# Patient Record
Sex: Male | Born: 1950 | Race: White | Hispanic: No | Smoking: Current every day smoker
Health system: Southern US, Community
[De-identification: ages and names within clinical notes are randomized; demographics above are authoritative.]

## PROBLEM LIST (undated history)

## (undated) DIAGNOSIS — Z992 Dependence on renal dialysis: Secondary | ICD-10-CM

## (undated) DIAGNOSIS — G4730 Sleep apnea, unspecified: Secondary | ICD-10-CM

## (undated) DIAGNOSIS — I2510 Atherosclerotic heart disease of native coronary artery without angina pectoris: Secondary | ICD-10-CM

## (undated) DIAGNOSIS — N189 Chronic kidney disease, unspecified: Secondary | ICD-10-CM

## (undated) DIAGNOSIS — I509 Heart failure, unspecified: Secondary | ICD-10-CM

## (undated) DIAGNOSIS — I10 Essential (primary) hypertension: Secondary | ICD-10-CM

## (undated) DIAGNOSIS — D649 Anemia, unspecified: Secondary | ICD-10-CM

## (undated) HISTORY — PX: OTHER SURGICAL HISTORY: SHX169

## (undated) HISTORY — DX: Sleep apnea, unspecified: G47.30

## (undated) HISTORY — DX: Atherosclerotic heart disease of native coronary artery without angina pectoris: I25.10

## (undated) HISTORY — PX: CORONARY ANGIOPLASTY: SHX604

## (undated) HISTORY — PX: CARDIAC SURGERY: SHX584

## (undated) HISTORY — PX: APPENDECTOMY: SHX54

## (undated) HISTORY — PX: CARDIAC CATHETERIZATION: SHX172

---

## 2002-06-26 ENCOUNTER — Ambulatory Visit (HOSPITAL_COMMUNITY): Admission: RE | Admit: 2002-06-26 | Discharge: 2002-06-26 | Payer: Self-pay | Admitting: Cardiology

## 2002-06-26 ENCOUNTER — Encounter: Payer: Self-pay | Admitting: Cardiology

## 2005-10-15 DIAGNOSIS — N189 Chronic kidney disease, unspecified: Secondary | ICD-10-CM

## 2005-10-15 HISTORY — DX: Chronic kidney disease, unspecified: N18.9

## 2005-10-15 HISTORY — PX: CORONARY ARTERY BYPASS GRAFT: SHX141

## 2005-11-28 ENCOUNTER — Inpatient Hospital Stay (HOSPITAL_COMMUNITY): Admission: AD | Admit: 2005-11-28 | Discharge: 2005-12-08 | Payer: Self-pay | Admitting: Family Medicine

## 2005-12-05 ENCOUNTER — Encounter: Payer: Self-pay | Admitting: Vascular Surgery

## 2005-12-21 ENCOUNTER — Ambulatory Visit (HOSPITAL_COMMUNITY): Admission: RE | Admit: 2005-12-21 | Discharge: 2005-12-21 | Payer: Self-pay | Admitting: Vascular Surgery

## 2005-12-28 ENCOUNTER — Ambulatory Visit (HOSPITAL_COMMUNITY): Admission: RE | Admit: 2005-12-28 | Discharge: 2005-12-28 | Payer: Self-pay | Admitting: *Deleted

## 2006-02-18 ENCOUNTER — Ambulatory Visit: Payer: Self-pay | Admitting: Orthopedic Surgery

## 2006-05-08 ENCOUNTER — Inpatient Hospital Stay (HOSPITAL_COMMUNITY): Admission: RE | Admit: 2006-05-08 | Discharge: 2006-05-13 | Payer: Self-pay | Admitting: Surgery

## 2006-06-25 ENCOUNTER — Encounter: Admission: RE | Admit: 2006-06-25 | Discharge: 2006-06-25 | Payer: Self-pay | Admitting: Surgery

## 2006-09-21 ENCOUNTER — Emergency Department (HOSPITAL_COMMUNITY): Admission: EM | Admit: 2006-09-21 | Discharge: 2006-09-21 | Payer: Self-pay | Admitting: Emergency Medicine

## 2006-10-09 ENCOUNTER — Inpatient Hospital Stay (HOSPITAL_COMMUNITY): Admission: EM | Admit: 2006-10-09 | Discharge: 2006-10-14 | Payer: Self-pay | Admitting: Emergency Medicine

## 2006-10-12 ENCOUNTER — Encounter: Payer: Self-pay | Admitting: *Deleted

## 2006-11-13 ENCOUNTER — Encounter (HOSPITAL_COMMUNITY): Admission: RE | Admit: 2006-11-13 | Discharge: 2006-12-13 | Payer: Self-pay | Admitting: *Deleted

## 2006-12-16 ENCOUNTER — Encounter (HOSPITAL_COMMUNITY): Admission: RE | Admit: 2006-12-16 | Discharge: 2007-01-15 | Payer: Self-pay | Admitting: *Deleted

## 2007-01-17 ENCOUNTER — Encounter (HOSPITAL_COMMUNITY): Admission: RE | Admit: 2007-01-17 | Discharge: 2007-02-16 | Payer: Self-pay | Admitting: *Deleted

## 2007-04-21 ENCOUNTER — Encounter (HOSPITAL_COMMUNITY): Admission: RE | Admit: 2007-04-21 | Discharge: 2007-05-21 | Payer: Self-pay | Admitting: Family Medicine

## 2007-05-23 ENCOUNTER — Encounter (HOSPITAL_COMMUNITY): Admission: RE | Admit: 2007-05-23 | Discharge: 2007-06-22 | Payer: Self-pay | Admitting: Family Medicine

## 2007-12-04 DIAGNOSIS — G4730 Sleep apnea, unspecified: Secondary | ICD-10-CM

## 2007-12-04 HISTORY — DX: Sleep apnea, unspecified: G47.30

## 2008-05-12 ENCOUNTER — Ambulatory Visit: Payer: Self-pay | Admitting: Vascular Surgery

## 2008-05-17 ENCOUNTER — Inpatient Hospital Stay (HOSPITAL_COMMUNITY): Admission: RE | Admit: 2008-05-17 | Discharge: 2008-05-18 | Payer: Self-pay | Admitting: Vascular Surgery

## 2008-05-17 ENCOUNTER — Ambulatory Visit: Payer: Self-pay | Admitting: Vascular Surgery

## 2008-05-17 ENCOUNTER — Encounter: Payer: Self-pay | Admitting: Vascular Surgery

## 2008-06-02 ENCOUNTER — Ambulatory Visit: Payer: Self-pay | Admitting: Vascular Surgery

## 2008-11-17 ENCOUNTER — Ambulatory Visit: Payer: Self-pay | Admitting: Vascular Surgery

## 2009-04-15 ENCOUNTER — Encounter (INDEPENDENT_AMBULATORY_CARE_PROVIDER_SITE_OTHER): Payer: Self-pay | Admitting: *Deleted

## 2009-05-18 ENCOUNTER — Ambulatory Visit: Payer: Self-pay | Admitting: Gastroenterology

## 2009-05-18 DIAGNOSIS — K219 Gastro-esophageal reflux disease without esophagitis: Secondary | ICD-10-CM | POA: Insufficient documentation

## 2009-05-19 ENCOUNTER — Encounter: Payer: Self-pay | Admitting: Gastroenterology

## 2009-05-25 ENCOUNTER — Ambulatory Visit: Payer: Self-pay | Admitting: Gastroenterology

## 2009-05-25 ENCOUNTER — Encounter: Payer: Self-pay | Admitting: Gastroenterology

## 2009-05-25 ENCOUNTER — Ambulatory Visit (HOSPITAL_COMMUNITY): Admission: RE | Admit: 2009-05-25 | Discharge: 2009-05-25 | Payer: Self-pay | Admitting: Gastroenterology

## 2009-06-21 ENCOUNTER — Emergency Department (HOSPITAL_COMMUNITY): Admission: EM | Admit: 2009-06-21 | Discharge: 2009-06-21 | Payer: Self-pay | Admitting: Emergency Medicine

## 2009-10-04 ENCOUNTER — Emergency Department (HOSPITAL_COMMUNITY): Admission: EM | Admit: 2009-10-04 | Discharge: 2009-10-04 | Payer: Self-pay | Admitting: Emergency Medicine

## 2009-11-26 ENCOUNTER — Inpatient Hospital Stay (HOSPITAL_COMMUNITY): Admission: EM | Admit: 2009-11-26 | Discharge: 2009-11-30 | Payer: Self-pay | Admitting: Emergency Medicine

## 2009-11-29 ENCOUNTER — Encounter: Payer: Self-pay | Admitting: Gastroenterology

## 2010-01-18 DIAGNOSIS — I2510 Atherosclerotic heart disease of native coronary artery without angina pectoris: Secondary | ICD-10-CM

## 2010-01-18 HISTORY — DX: Atherosclerotic heart disease of native coronary artery without angina pectoris: I25.10

## 2010-07-10 ENCOUNTER — Encounter (INDEPENDENT_AMBULATORY_CARE_PROVIDER_SITE_OTHER): Payer: Self-pay | Admitting: *Deleted

## 2010-11-14 NOTE — Letter (Signed)
Summary: Recall Colonoscopy/Endoscopy, Change to Office Visit  [facility]  [address]   [place], [place] [address]   Phone: [phone]  Fax: [phone]      July 10, 2010   Frederick [name] [address] [place], [place]  [address] [birth year]   Dear [name],   According to our records, it is time for you to schedule a Colonoscopy/Endoscopy. However, after reviewing your medical record, we recommend an office visit in order to determine your need for a repeat procedure.  Please call [phone] at your convenience to schedule an office visit. If you have any questions or concerns, please feel free to contact our office.   Sincerely,   Ave Filter  [facility] Ph: [phone]   Fax: [phone]

## 2010-11-17 ENCOUNTER — Ambulatory Visit (HOSPITAL_COMMUNITY)
Admission: RE | Admit: 2010-11-17 | Discharge: 2010-11-17 | Disposition: A | Discharge status: Home / Self Care | Payer: Medicare Other | Source: Ambulatory Visit | Attending: Vascular Surgery | Admitting: Vascular Surgery

## 2010-11-17 DIAGNOSIS — N186 End stage renal disease: Secondary | ICD-10-CM | POA: Insufficient documentation

## 2010-11-17 DIAGNOSIS — Z538 Procedure and treatment not carried out for other reasons: Secondary | ICD-10-CM | POA: Insufficient documentation

## 2010-11-17 LAB — POCT I-STAT, CHEM 8
BUN: 45 mg/dL — ABNORMAL HIGH (ref 6–23)
Calcium, Ion: 1.31 mmol/L (ref 1.12–1.32)
Chloride: 101 mEq/L (ref 96–112)
HCT: 27 % — ABNORMAL LOW (ref 39.0–52.0)
Potassium: 3.9 mEq/L (ref 3.5–5.1)
TCO2: 30 mmol/L (ref 0–100)

## 2010-11-23 ENCOUNTER — Ambulatory Visit (INDEPENDENT_AMBULATORY_CARE_PROVIDER_SITE_OTHER): Payer: Medicare Other | Admitting: Vascular Surgery

## 2010-11-23 DIAGNOSIS — N186 End stage renal disease: Secondary | ICD-10-CM

## 2010-11-24 NOTE — Assessment & Plan Note (Signed)
OFFICE VISIT  CADEL, STAIRS DOB:  1951/08/23                                       11/23/2010 NUUVO#:[id number]  CHIEF COMPLAINT:  Poor flow left arm AV fistula.  HISTORY OF PRESENT ILLNESS:  The patient is a 60 year old male who is currently on chronic hemodialysis who was noted recently to have some diminished flow on a hemodialysis session.  The patient has had a longstanding left upper arm AV fistula.  He was recently scheduled for a fistulogram.  However, at the time of the fistulogram I discussed with the patient that I did not believe he needed a fistulogram, he just needed revision of his fistula due to longstanding chronic aneurysmal degeneration.  He presents today for further followup regarding this. He denies any symptoms of steal in his left hand.  He states that the fistula has actually been working well with better flow since last time he was seen a week ago.  REVIEW OF SYSTEMS:  He denies any shortness of breath or chest pain.  PHYSICAL EXAM:  Vital signs:  Blood pressure is 167/91 in the right arm, heart rate is 70 and regular, respirations 18.  Left upper extremity has an easily palpable thrill.  There is aneurysmal degeneration of the left upper arm AV fistula with 2 aneurysms greater than 4 cm in diameter. There is some shiny skin and thinning of the skin over these aneurysms.  I believe the best option for the patient at this point would be revision of his AV fistula with placement of a Gore-Tex interposition graft around the 2 areas of aneurysm.  I believe he is at risk of bleeding long-term and also in light of the fact that he is having flow problems he most likely has some element of stenosis at the areas of these aneurysms.  We have scheduled him for revision of the left arm AV fistula and converting to an AV graft on 12/04/2010.  We will also need to place a catheter at that time as the graft will not be stickable  for several weeks after that.  The risks, benefits, possible complications were explained to the patient today including but not limited to graft thrombosis, bleeding, infection.  He understands and agrees to proceed.    Janetta Hora. Fields, MD Electronically Signed  CEF/MEDQ  D:  11/24/2010  T:  11/24/2010  Job:  [id number]  cc:   [facility]

## 2010-12-04 ENCOUNTER — Ambulatory Visit (HOSPITAL_COMMUNITY): Payer: Medicare Other

## 2010-12-04 ENCOUNTER — Ambulatory Visit (HOSPITAL_COMMUNITY)
Admission: RE | Admit: 2010-12-04 | Discharge: 2010-12-04 | Disposition: A | Discharge status: Home / Self Care | Payer: Medicare Other | Source: Ambulatory Visit | Attending: Vascular Surgery | Admitting: Vascular Surgery

## 2010-12-04 DIAGNOSIS — Z130 Encounter for screening for diseases of the blood and blood-forming organs and certain disorders involving the immune mechanism: Secondary | ICD-10-CM | POA: Insufficient documentation

## 2010-12-04 DIAGNOSIS — T82898A Other specified complication of vascular prosthetic devices, implants and grafts, initial encounter: Secondary | ICD-10-CM | POA: Insufficient documentation

## 2010-12-04 DIAGNOSIS — N186 End stage renal disease: Secondary | ICD-10-CM

## 2010-12-04 DIAGNOSIS — I120 Hypertensive chronic kidney disease with stage 5 chronic kidney disease or end stage renal disease: Secondary | ICD-10-CM

## 2010-12-04 DIAGNOSIS — Z01810 Encounter for preprocedural cardiovascular examination: Secondary | ICD-10-CM | POA: Insufficient documentation

## 2010-12-04 DIAGNOSIS — Y832 Surgical operation with anastomosis, bypass or graft as the cause of abnormal reaction of the patient, or of later complication, without mention of misadventure at the time of the procedure: Secondary | ICD-10-CM | POA: Insufficient documentation

## 2010-12-04 LAB — POCT I-STAT 4, (NA,K, GLUC, HGB,HCT)
Glucose, Bld: 104 mg/dL — ABNORMAL HIGH (ref 70–99)
HCT: 30 % — ABNORMAL LOW (ref 39.0–52.0)
Potassium: 4.4 mEq/L (ref 3.5–5.1)

## 2010-12-06 NOTE — Op Note (Signed)
NAMEPREVIN, JIAN NO.:  [id number]  MEDICAL RECORD NO.:  [id number]           PATIENT TYPE:  O  LOCATION:  [facility]                         FACILITY:  [facility]  PHYSICIAN:  Iasiah Ozment E. Tila Millirons, MD  DATE OF BIRTH:  01/01/1951  DATE OF PROCEDURE:  12/04/2010 DATE OF DISCHARGE:  12/04/2010                              OPERATIVE REPORT   PROCEDURES: 1. Left upper arm arteriovenous graft. 2. Ultrasound of the neck. 3. Placement of right internal jugular vein Diatek catheter.  PREOPERATIVE DIAGNOSES: 1. Aneurysmal degeneration, left upper arm arteriovenous fistula. 2. End-stage renal disease.  POSTOPERATIVE DIAGNOSES: 1. Aneurysmal degeneration, left upper arm arteriovenous fistula. 2. End-stage renal disease.  ANESTHESIA:  Local with IV sedation.  ASSISTANT:  Pecola Leisure, PA-C  OPERATIVE FINDINGS: 1. A 23-cm Diatek catheter, right internal jugular vein. 2. An 8-mm interposition graft, left upper arm.OPERATIVE DETAILS:  After obtaining informed consent, the patient was taken to the operating room.  The patient was placed in the supine position on the operating table.  After adequate sedation, the patient's entire neck and chest were prepped and draped in usual sterile fashion. Local anesthesia was infiltrated over the right internal jugular vein. Ultrasound was used to identify the right internal jugular vein.  This had normal compressibility and respiratory variation.  Next, an introducer needle was used to cannulate the right internal jugular vein using ultrasound guidance.  A 0.035 J-tip guidewire was then threaded through the right internal jugular vein down into the inferior vena cava under fluoroscopic guidance.  Sequential 12, 14, 16-French dilators with peel-away sheath were then placed over the guidewire in the right atrium.  The guidewire and dilator removed and a 23-cm Diatek catheter was placed through the peel-away sheath in the right atrium.   Catheter was then tunneled subcutaneously, cut to length and the hub attached. The catheters noted to flush and draw easily.  Catheter was inspected under fluoroscopy, found its tip to be in the right atrium, no kinks throughout its course.  The catheter was sutured to skin with nylon sutures.  Neck insertion site was closed with Vicryl stitch.  Catheter was then loaded with concentrated heparin solution.  Attention was then turned to the patient's left upper extremity.  The patient had aneurysmal degeneration of the left upper arm AV fistula.  The entire left upper extremity was prepped and draped in usual sterile fashion. Next, an oblique incision was made near the antecubital crease, incision was carried down through subcutaneous tissues down to the level of preexisting AV fistula.  Dissection was carried all the way down to the level of the arterial anastomosis.  The fistula was dissected free circumferentially in this location.  This was several centimeters below the primary proximal aneurysm.  Next, a transverse incision was made in the upper arm above the area of a second degenerated aneurysm.  This was done after infiltration with local anesthesia.  Incision was carried down through subcutaneous tissues down level of fistula.  The fistula was dissected free circumferentially.  Vessel loops placed around this. Fistula diameter was fairly large proximally and distally and an 8-mm graft  was selected to replace the aneurysmal segment.  Next, a subcutaneous tunnel was created lateral to the old aneurysm, so an 8-mm PTFE graft was brought through this.  The patient was then given 5000 units of intravenous heparin.  The graft was clamped at the level of the arterial anastomosis and distally just below the proximal aneurysm.  The fistula was then divided at this level.  The aneurysmal segment was oversewn with running 5-0 Prolene suture.  The proximal segment was then sewn end-to-end to  38-mm graft using a running 6-0 Prolene suture.  Just prior to completion anastomosis, this was fore-bled, back-bled, and thoroughly flushed.  Anastomosis was secured.  Clamps released as pulsatile flow in the distal portion of the graft immediately.  Proximal anastomosis were packed with thrombin and Gelfoam.  Attention was then turned to the distal anastomosis.  The distal AV fistula was clamped with a fistula clamp.  The proximal AV fistula was then transected with scissors and oversewn just distal to the more distal aneurysm.  After hemostasis was obtained, the graft was cut to length and sewn end-to-end to the old segment of cephalic vein using a running 6-0 Prolene suture. Just prior to completion anastomosis, this was fore-bled, back-bled, and thoroughly flushed.  Anastomosis was secured.  Clamps released as palpable thrill above graft immediately.  Hemostasis was obtained with a repair stitch at the proximal and the distal anastomosis with some thrombin and Gelfoam direct pressure.  The patient was also given 50 mg of protamine.  The patient tolerated the procedure well and there were no complications.  Sponge and needle counts correct at the end of the case.  The patient was taken to recovery room in stable condition.     Janetta Hora. Haydin Dunn, MD     CEF/MEDQ  D:  12/04/2010  T:  12/05/2010  Job:  [id number]  Electronically Signed by Fabienne Bruns MD on 12/06/2010 03:09:34 PM

## 2010-12-12 ENCOUNTER — Other Ambulatory Visit (HOSPITAL_COMMUNITY): Payer: Medicare Other

## 2010-12-13 ENCOUNTER — Other Ambulatory Visit: Payer: Self-pay | Admitting: Nephrology

## 2010-12-13 ENCOUNTER — Encounter (HOSPITAL_COMMUNITY): Payer: Medicare Other | Attending: Oncology

## 2010-12-13 DIAGNOSIS — N186 End stage renal disease: Secondary | ICD-10-CM | POA: Insufficient documentation

## 2010-12-13 DIAGNOSIS — D631 Anemia in chronic kidney disease: Secondary | ICD-10-CM | POA: Insufficient documentation

## 2010-12-13 DIAGNOSIS — D649 Anemia, unspecified: Secondary | ICD-10-CM

## 2010-12-13 DIAGNOSIS — Z992 Dependence on renal dialysis: Secondary | ICD-10-CM | POA: Insufficient documentation

## 2010-12-13 DIAGNOSIS — I120 Hypertensive chronic kidney disease with stage 5 chronic kidney disease or end stage renal disease: Secondary | ICD-10-CM | POA: Insufficient documentation

## 2010-12-13 DIAGNOSIS — N039 Chronic nephritic syndrome with unspecified morphologic changes: Secondary | ICD-10-CM | POA: Insufficient documentation

## 2010-12-13 LAB — ABO/RH: ABO/RH(D): A POS

## 2010-12-15 ENCOUNTER — Encounter (HOSPITAL_COMMUNITY): Payer: Medicare Other

## 2010-12-15 ENCOUNTER — Encounter (HOSPITAL_COMMUNITY): Payer: Medicare Other | Attending: Oncology

## 2010-12-15 DIAGNOSIS — D631 Anemia in chronic kidney disease: Secondary | ICD-10-CM | POA: Insufficient documentation

## 2010-12-15 DIAGNOSIS — N039 Chronic nephritic syndrome with unspecified morphologic changes: Secondary | ICD-10-CM | POA: Insufficient documentation

## 2010-12-15 DIAGNOSIS — N186 End stage renal disease: Secondary | ICD-10-CM | POA: Insufficient documentation

## 2010-12-15 DIAGNOSIS — Z992 Dependence on renal dialysis: Secondary | ICD-10-CM | POA: Insufficient documentation

## 2010-12-16 LAB — CROSSMATCH
ABO/RH(D): A POS
Antibody Screen: NEGATIVE
Unit division: 0
Unit division: 0

## 2011-01-03 LAB — DIFFERENTIAL
Basophils Absolute: 0 10*3/uL (ref 0.0–0.1)
Basophils Absolute: 0 10*3/uL (ref 0.0–0.1)
Basophils Absolute: 0 10*3/uL (ref 0.0–0.1)
Basophils Relative: 0 % (ref 0–1)
Basophils Relative: 0 % (ref 0–1)
Eosinophils Absolute: 0 10*3/uL (ref 0.0–0.7)
Eosinophils Absolute: 0 10*3/uL (ref 0.0–0.7)
Eosinophils Absolute: 0 10*3/uL (ref 0.0–0.7)
Eosinophils Relative: 0 % (ref 0–5)
Lymphocytes Relative: 10 % — ABNORMAL LOW (ref 12–46)
Lymphocytes Relative: 5 % — ABNORMAL LOW (ref 12–46)
Lymphocytes Relative: 7 % — ABNORMAL LOW (ref 12–46)
Lymphs Abs: 0.5 10*3/uL — ABNORMAL LOW (ref 0.7–4.0)
Monocytes Absolute: 0.5 10*3/uL (ref 0.1–1.0)
Monocytes Absolute: 0.5 10*3/uL (ref 0.1–1.0)
Monocytes Absolute: 0.6 10*3/uL (ref 0.1–1.0)
Monocytes Relative: 10 % (ref 3–12)
Monocytes Relative: 12 % (ref 3–12)
Monocytes Relative: 5 % (ref 3–12)
Neutro Abs: 9.3 10*3/uL — ABNORMAL HIGH (ref 1.7–7.7)
Neutrophils Relative %: 77 % (ref 43–77)
Neutrophils Relative %: 86 % — ABNORMAL HIGH (ref 43–77)
Neutrophils Relative %: 88 % — ABNORMAL HIGH (ref 43–77)
Neutrophils Relative %: 90 % — ABNORMAL HIGH (ref 43–77)

## 2011-01-03 LAB — BASIC METABOLIC PANEL
BUN: 27 mg/dL — ABNORMAL HIGH (ref 6–23)
BUN: 69 mg/dL — ABNORMAL HIGH (ref 6–23)
BUN: 72 mg/dL — ABNORMAL HIGH (ref 6–23)
CO2: 22 mEq/L (ref 19–32)
CO2: 23 mEq/L (ref 19–32)
CO2: 26 mEq/L (ref 19–32)
Calcium: 8.7 mg/dL (ref 8.4–10.5)
Calcium: 8.9 mg/dL (ref 8.4–10.5)
Chloride: 100 mEq/L (ref 96–112)
Chloride: 102 mEq/L (ref 96–112)
Chloride: 99 mEq/L (ref 96–112)
Creatinine, Ser: 6.4 mg/dL — ABNORMAL HIGH (ref 0.4–1.5)
Creatinine, Ser: 8.12 mg/dL — ABNORMAL HIGH (ref 0.4–1.5)
Creatinine, Ser: 9.89 mg/dL — ABNORMAL HIGH (ref 0.4–1.5)
Creatinine, Ser: 9.97 mg/dL — ABNORMAL HIGH (ref 0.4–1.5)
GFR calc Af Amer: 7 mL/min — ABNORMAL LOW (ref >60)
GFR calc Af Amer: 8 mL/min — ABNORMAL LOW (ref >60)
GFR calc non Af Amer: 5 mL/min — ABNORMAL LOW (ref >60)
Glucose, Bld: 115 mg/dL — ABNORMAL HIGH (ref 70–99)
Glucose, Bld: 86 mg/dL (ref 70–99)
Potassium: 4.6 mEq/L (ref 3.5–5.1)
Sodium: 136 mEq/L (ref 135–145)
Sodium: 140 mEq/L (ref 135–145)

## 2011-01-03 LAB — CLOSTRIDIUM DIFFICILE EIA: C difficile Toxins A+B, EIA: NEGATIVE

## 2011-01-03 LAB — BLOOD GAS, ARTERIAL
Acid-base deficit: 3.4 mmol/L — ABNORMAL HIGH (ref 0.0–2.0)
FIO2: 100 %
TCO2: 19.6 mmol/L (ref 0–100)
pCO2 arterial: 38.1 mmHg (ref 35.0–45.0)
pCO2 arterial: 41.5 mmHg (ref 35.0–45.0)
pH, Arterial: 7.363 (ref 7.350–7.450)
pH, Arterial: 7.38 (ref 7.350–7.450)
pO2, Arterial: 52.4 mmHg — ABNORMAL LOW (ref 80.0–100.0)
pO2, Arterial: 53.3 mmHg — ABNORMAL LOW (ref 80.0–100.0)

## 2011-01-03 LAB — CK TOTAL AND CKMB (NOT AT ARMC)
CK, MB: 1.8 ng/mL (ref 0.3–4.0)
CK, MB: 14.4 ng/mL (ref 0.3–4.0)
CK, MB: 5.5 ng/mL — ABNORMAL HIGH (ref 0.3–4.0)
Relative Index: 0.9 (ref 0.0–2.5)
Total CK: 119 U/L (ref 7–232)
Total CK: 192 U/L (ref 7–232)
Total CK: 233 U/L — ABNORMAL HIGH (ref 7–232)

## 2011-01-03 LAB — HEPATIC FUNCTION PANEL
AST: 61 U/L — ABNORMAL HIGH (ref 0–37)
Albumin: 3.5 g/dL (ref 3.5–5.2)
Total Protein: 6.6 g/dL (ref 6.0–8.3)

## 2011-01-03 LAB — CBC
HCT: 35.7 % — ABNORMAL LOW (ref 39.0–52.0)
Hemoglobin: 11.7 g/dL — ABNORMAL LOW (ref 13.0–17.0)
Hemoglobin: 11.8 g/dL — ABNORMAL LOW (ref 13.0–17.0)
MCHC: 32.3 g/dL (ref 30.0–36.0)
MCHC: 32.7 g/dL (ref 30.0–36.0)
MCV: 100.8 fL — ABNORMAL HIGH (ref 78.0–100.0)
MCV: 98.8 fL (ref 78.0–100.0)
Platelets: 106 10*3/uL — ABNORMAL LOW (ref 150–400)
Platelets: 91 10*3/uL — ABNORMAL LOW (ref 150–400)
Platelets: 95 10*3/uL — ABNORMAL LOW (ref 150–400)
RBC: 3.57 MIL/uL — ABNORMAL LOW (ref 4.22–5.81)
RBC: 3.64 MIL/uL — ABNORMAL LOW (ref 4.22–5.81)
RDW: 18.7 % — ABNORMAL HIGH (ref 11.5–15.5)
WBC: 10.4 10*3/uL (ref 4.0–10.5)
WBC: 5.2 10*3/uL (ref 4.0–10.5)
WBC: 6.9 10*3/uL (ref 4.0–10.5)
WBC: 7.2 10*3/uL (ref 4.0–10.5)

## 2011-01-03 LAB — TROPONIN I
Troponin I: 0.17 ng/mL — ABNORMAL HIGH (ref 0.00–0.06)
Troponin I: 16.25 ng/mL (ref 0.00–0.06)

## 2011-01-03 LAB — GIARDIA/CRYPTOSPORIDIUM SCREEN(EIA): Giardia Screen - EIA: NEGATIVE

## 2011-01-03 LAB — FECAL LACTOFERRIN, QUANT

## 2011-01-03 LAB — MRSA PCR SCREENING: MRSA by PCR: NEGATIVE

## 2011-01-03 LAB — CULTURE, BLOOD (ROUTINE X 2): Report Status: 2172011

## 2011-01-03 LAB — BRAIN NATRIURETIC PEPTIDE: Pro B Natriuretic peptide (BNP): >3200 pg/mL — ABNORMAL HIGH (ref 0.0–100.0)

## 2011-01-03 LAB — LACTIC ACID, PLASMA: Lactic Acid, Venous: 1.6 mmol/L (ref 0.5–2.2)

## 2011-01-03 LAB — STOOL CULTURE

## 2011-01-04 ENCOUNTER — Ambulatory Visit (INDEPENDENT_AMBULATORY_CARE_PROVIDER_SITE_OTHER): Payer: Medicare Other | Admitting: Vascular Surgery

## 2011-01-04 DIAGNOSIS — N186 End stage renal disease: Secondary | ICD-10-CM

## 2011-01-05 NOTE — Assessment & Plan Note (Signed)
OFFICE VISIT  ARTUR, WINNINGHAM DOB:  11-15-1950                                       01/04/2011 [id number]#:[id number]  The patient returns for followup today after revision of his AV fistula to an AV graft for aneurysmal degeneration.  He states that they successfully cannulated his graft today.  He reports no drainage from the incision.  He has no symptoms of steal in the hand.  PHYSICAL EXAM:  Today blood pressure is 177/90 in the right arm, heart rate is 80 and regular, respirations 20, temperature 97.8.  Left upper extremity:  The incisions are well-healed over the left arm graft. There is an audible bruit within this.  He states that his catheter is working okay.  There is no evidence of infection on this today.  Overall the patient appears to be well at this point.  As soon as they are satisfied that the graft is working well they will schedule him in the near future for removal of his catheter.  He will follow up with me on an as needed basis.    Janetta Hora. Fields, MD Electronically Signed  CEF/MEDQ  D:  01/04/2011  T:  01/05/2011  Job:  [id number]  cc:   [facility]

## 2011-01-07 ENCOUNTER — Inpatient Hospital Stay (HOSPITAL_COMMUNITY)
Admission: EM | Admit: 2011-01-07 | Discharge: 2011-01-10 | DRG: 246 | Disposition: A | Discharge status: Home / Self Care | Payer: Medicare Other | Attending: Cardiovascular Disease | Admitting: Cardiovascular Disease

## 2011-01-07 ENCOUNTER — Emergency Department (HOSPITAL_COMMUNITY): Payer: Medicare Other

## 2011-01-07 ENCOUNTER — Inpatient Hospital Stay (HOSPITAL_COMMUNITY): Payer: Medicare Other

## 2011-01-07 DIAGNOSIS — I25810 Atherosclerosis of coronary artery bypass graft(s) without angina pectoris: Secondary | ICD-10-CM | POA: Diagnosis present

## 2011-01-07 DIAGNOSIS — I2582 Chronic total occlusion of coronary artery: Secondary | ICD-10-CM | POA: Diagnosis present

## 2011-01-07 DIAGNOSIS — I120 Hypertensive chronic kidney disease with stage 5 chronic kidney disease or end stage renal disease: Secondary | ICD-10-CM | POA: Diagnosis present

## 2011-01-07 DIAGNOSIS — I059 Rheumatic mitral valve disease, unspecified: Secondary | ICD-10-CM | POA: Diagnosis present

## 2011-01-07 DIAGNOSIS — Z7982 Long term (current) use of aspirin: Secondary | ICD-10-CM

## 2011-01-07 DIAGNOSIS — N186 End stage renal disease: Secondary | ICD-10-CM | POA: Diagnosis present

## 2011-01-07 DIAGNOSIS — R195 Other fecal abnormalities: Secondary | ICD-10-CM | POA: Diagnosis not present

## 2011-01-07 DIAGNOSIS — I2589 Other forms of chronic ischemic heart disease: Secondary | ICD-10-CM | POA: Diagnosis present

## 2011-01-07 DIAGNOSIS — D649 Anemia, unspecified: Secondary | ICD-10-CM | POA: Diagnosis present

## 2011-01-07 DIAGNOSIS — I2510 Atherosclerotic heart disease of native coronary artery without angina pectoris: Secondary | ICD-10-CM | POA: Diagnosis present

## 2011-01-07 DIAGNOSIS — Z7902 Long term (current) use of antithrombotics/antiplatelets: Secondary | ICD-10-CM

## 2011-01-07 DIAGNOSIS — E785 Hyperlipidemia, unspecified: Secondary | ICD-10-CM | POA: Diagnosis present

## 2011-01-07 DIAGNOSIS — Z992 Dependence on renal dialysis: Secondary | ICD-10-CM

## 2011-01-07 DIAGNOSIS — Z79899 Other long term (current) drug therapy: Secondary | ICD-10-CM

## 2011-01-07 DIAGNOSIS — I214 Non-ST elevation (NSTEMI) myocardial infarction: Principal | ICD-10-CM | POA: Diagnosis present

## 2011-01-07 DIAGNOSIS — D696 Thrombocytopenia, unspecified: Secondary | ICD-10-CM | POA: Diagnosis present

## 2011-01-07 LAB — POCT I-STAT 3, ART BLOOD GAS (G3+)
Bicarbonate: 37 mEq/L — ABNORMAL HIGH (ref 20.0–24.0)
Patient temperature: 98.7
pH, Arterial: 7.432 (ref 7.350–7.450)
pO2, Arterial: 60 mmHg — ABNORMAL LOW (ref 80.0–100.0)

## 2011-01-07 LAB — POCT I-STAT, CHEM 8
Glucose, Bld: 157 mg/dL — ABNORMAL HIGH (ref 70–99)
HCT: 40 % (ref 39.0–52.0)
Hemoglobin: 13.6 g/dL (ref 13.0–17.0)
Potassium: 4.3 mEq/L (ref 3.5–5.1)

## 2011-01-07 LAB — MRSA PCR SCREENING: MRSA by PCR: NEGATIVE

## 2011-01-07 LAB — DIFFERENTIAL
Basophils Absolute: 0.1 10*3/uL (ref 0.0–0.1)
Basophils Relative: 1 % (ref 0–1)
Eosinophils Absolute: 0.1 10*3/uL (ref 0.0–0.7)
Eosinophils Relative: 1 % (ref 0–5)
Lymphocytes Relative: 10 % — ABNORMAL LOW (ref 12–46)

## 2011-01-07 LAB — CBC
HCT: 38.9 % — ABNORMAL LOW (ref 39.0–52.0)
Hemoglobin: 10.8 g/dL — ABNORMAL LOW (ref 13.0–17.0)
MCHC: 30.4 g/dL (ref 30.0–36.0)
Platelets: 127 10*3/uL — ABNORMAL LOW (ref 150–400)
Platelets: 97 10*3/uL — ABNORMAL LOW (ref 150–400)
RBC: 3.42 MIL/uL — ABNORMAL LOW (ref 4.22–5.81)
RDW: 16.5 % — ABNORMAL HIGH (ref 11.5–15.5)
WBC: 11.1 10*3/uL — ABNORMAL HIGH (ref 4.0–10.5)

## 2011-01-07 LAB — TSH: TSH: 1.068 u[IU]/mL (ref 0.350–4.500)

## 2011-01-07 LAB — BASIC METABOLIC PANEL
BUN: 25 mg/dL — ABNORMAL HIGH (ref 6–23)
Chloride: 91 mEq/L — ABNORMAL LOW (ref 96–112)
Glucose, Bld: 160 mg/dL — ABNORMAL HIGH (ref 70–99)
Potassium: 4.1 mEq/L (ref 3.5–5.1)

## 2011-01-07 LAB — MAGNESIUM: Magnesium: 2.8 mg/dL — ABNORMAL HIGH (ref 1.5–2.5)

## 2011-01-07 LAB — POCT CARDIAC MARKERS
CKMB, poc: 3 ng/mL (ref 1.0–8.0)
Myoglobin, poc: 480 ng/mL (ref 12–200)

## 2011-01-07 LAB — TROPONIN I: Troponin I: 0.07 ng/mL — ABNORMAL HIGH (ref 0.00–0.06)

## 2011-01-07 LAB — CARDIAC PANEL(CRET KIN+CKTOT+MB+TROPI)
CK, MB: 12.8 ng/mL (ref 0.3–4.0)
Relative Index: 11.7 — ABNORMAL HIGH (ref 0.0–2.5)

## 2011-01-07 LAB — CK TOTAL AND CKMB (NOT AT ARMC): CK, MB: 2.8 ng/mL (ref 0.3–4.0)

## 2011-01-07 LAB — PHOSPHORUS: Phosphorus: 3.9 mg/dL (ref 2.3–4.6)

## 2011-01-08 ENCOUNTER — Inpatient Hospital Stay (HOSPITAL_COMMUNITY): Payer: Medicare Other

## 2011-01-08 LAB — CBC
Platelets: 100 10*3/uL — ABNORMAL LOW (ref 150–400)
RBC: 3.28 MIL/uL — ABNORMAL LOW (ref 4.22–5.81)
WBC: 11.3 10*3/uL — ABNORMAL HIGH (ref 4.0–10.5)

## 2011-01-08 LAB — BASIC METABOLIC PANEL
Chloride: 99 mEq/L (ref 96–112)
GFR calc Af Amer: 17 mL/min — ABNORMAL LOW (ref >60)
Potassium: 4 mEq/L (ref 3.5–5.1)
Sodium: 139 mEq/L (ref 135–145)

## 2011-01-08 LAB — LIPID PANEL
Cholesterol: 100 mg/dL (ref 0–200)
HDL: 32 mg/dL — ABNORMAL LOW (ref >39)
LDL Cholesterol: 40 mg/dL (ref 0–99)
Total CHOL/HDL Ratio: 3.1 RATIO
Triglycerides: 141 mg/dL (ref <150)

## 2011-01-08 LAB — POCT ACTIVATED CLOTTING TIME: Activated Clotting Time: 582 seconds

## 2011-01-08 LAB — CARDIAC PANEL(CRET KIN+CKTOT+MB+TROPI): Total CK: 70 U/L (ref 7–232)

## 2011-01-09 ENCOUNTER — Inpatient Hospital Stay (HOSPITAL_COMMUNITY): Payer: Medicare Other

## 2011-01-09 DIAGNOSIS — N186 End stage renal disease: Secondary | ICD-10-CM

## 2011-01-09 DIAGNOSIS — D649 Anemia, unspecified: Secondary | ICD-10-CM

## 2011-01-09 DIAGNOSIS — Z860100 Personal history of colon polyps, unspecified: Secondary | ICD-10-CM

## 2011-01-09 DIAGNOSIS — R195 Other fecal abnormalities: Secondary | ICD-10-CM

## 2011-01-09 DIAGNOSIS — I120 Hypertensive chronic kidney disease with stage 5 chronic kidney disease or end stage renal disease: Secondary | ICD-10-CM

## 2011-01-09 DIAGNOSIS — Z452 Encounter for adjustment and management of vascular access device: Secondary | ICD-10-CM

## 2011-01-09 LAB — CBC
HCT: 31 % — ABNORMAL LOW (ref 39.0–52.0)
Hemoglobin: 9.3 g/dL — ABNORMAL LOW (ref 13.0–17.0)
RDW: 16.3 % — ABNORMAL HIGH (ref 11.5–15.5)
WBC: 6.7 10*3/uL (ref 4.0–10.5)

## 2011-01-09 LAB — RENAL FUNCTION PANEL
CO2: 29 mEq/L (ref 19–32)
Calcium: 9.9 mg/dL (ref 8.4–10.5)
GFR calc Af Amer: 11 mL/min — ABNORMAL LOW (ref >60)
GFR calc non Af Amer: 9 mL/min — ABNORMAL LOW (ref >60)
Sodium: 136 mEq/L (ref 135–145)

## 2011-01-09 LAB — IRON AND TIBC
Iron: 51 ug/dL (ref 42–135)
UIBC: 265 ug/dL

## 2011-01-10 ENCOUNTER — Encounter (HOSPITAL_COMMUNITY): Payer: Medicare Other

## 2011-01-10 LAB — RENAL FUNCTION PANEL
Albumin: 3 g/dL — ABNORMAL LOW (ref 3.5–5.2)
Calcium: 9.7 mg/dL (ref 8.4–10.5)
Phosphorus: 2 mg/dL — ABNORMAL LOW (ref 2.3–4.6)
Potassium: 3.8 mEq/L (ref 3.5–5.1)
Sodium: 136 mEq/L (ref 135–145)

## 2011-01-10 LAB — CBC
HCT: 32.4 % — ABNORMAL LOW (ref 39.0–52.0)
Hemoglobin: 9.8 g/dL — ABNORMAL LOW (ref 13.0–17.0)
MCV: 101.9 fL — ABNORMAL HIGH (ref 78.0–100.0)
RBC: 3.18 MIL/uL — ABNORMAL LOW (ref 4.22–5.81)
RDW: 15.9 % — ABNORMAL HIGH (ref 11.5–15.5)
WBC: 6.3 10*3/uL (ref 4.0–10.5)

## 2011-01-10 LAB — TROPONIN I: Troponin I: 1.17 ng/mL (ref 0.00–0.06)

## 2011-01-10 LAB — IRON AND TIBC: Iron: 62 ug/dL (ref 42–135)

## 2011-01-11 LAB — TYPE AND SCREEN: Unit division: 0

## 2011-01-11 NOTE — Procedures (Signed)
NAMEJOANATHAN, AFFELDT NO.:  [id number]  MEDICAL RECORD NO.:  [id number]           PATIENT TYPE:  I  LOCATION:  2907                         FACILITY:  [facility]  PHYSICIAN:  Nicki Guadalajara, M.D.     DATE OF BIRTH:  Oct 30, 1950  DATE OF PROCEDURE:  01/07/2011 DATE OF DISCHARGE:                           CARDIAC CATHETERIZATION   INDICATION:  [name] is a 60 year old gentleman who has end- stage renal disease on hemodialysis Tuesday, Thursdays, and Saturdays in [place].  The patient is status post CABG surgery x5 in 2007 by Dr. Laneta Simmers.  His last catheterization early postoperatively showed patent grafts.  The patient also has peripheral vascular disease and is status post left carotid endarterectomy and has documented lower extremity peripheral vascular disease.  He apparently developed chest discomfort early this morning.  EMS arrived in his house.  Blood pressure was 230/130.  The patient was not very responsive.  He had labored respirations.  He apparently was given nitroglycerin.  A code STEMI was called.  He was transported to [facility] Emergency Room. Review of the ECG did suggest some early ST elevation anteriorly, which did improve on subsequent ECG, but he also had new anterolateral T changes.  In the emergency room, BiPAP was started.  He was taken emergently to the cardiac catheterization laboratory.  PROCEDURE:  After premedication, the patient was prepped and draped in usual fashion.  Right femoral artery was punctured anteriorly and a 6- French sheath was inserted.  Diagnostic catheterization was done utilizing 6-French Judkins for left and right coronary catheters.  Right catheter was used for selective angiography into the vein graft to the right coronary artery.  Documented old occluded vein graft to the diagonal vessel, the vein graft to the circumflex marginal as well as the intermediate as well as the LIMA graft to the distal  LAD.  Although the grafts were similar to the prior study with old occlusion of the vein graft to the diagonal, there was now new 95% calcified proximal LAD stenosis, which is not supplied by the LIMA graft since the graft hooked into the distal LAD beyond it was totally occluded.  It was felt that this was the culprit lesion.  In the lab, the patient was hypertensive. He received IV Lopressor 2.5 mg x3 for a total of 7.5 mg.  He also was titrated up to 60 mcg of intravenous nitroglycerin.  The decision was made to attempt intervention to the native LAD.  A 6-French XB 4.0 LAD guide was used for the procedure.  He received an additional 150 mg of Plavix in the lab.  Bivalirudin was administered.  A Prowater wire was able to be advanced down the LAD.  Initial dilatation was done with a 2.5 x 12-mm apex balloon.  A 3.0 x 15-mm DES Resolute stent was then inserted with the stent positioned just beyond the ostium of the LAD to cover the 95% stenosis as well as the takeoff of the first septal perforating artery, but the stent ended proximal to the takeoff of the first diagonal vessel.  This was postdilated with a 3.25  x 12-mm noncompliant trek.  The 95% stenosis was reduced to 0%.  After scout angiography confirmed an excellent angiographic result, the guide, wire, and balloon were removed.  A 6-French pigtail catheter was then inserted and RAO ventriculography was performed.  The patient left the catheterization laboratory in stable condition with plans for dialysis later today.  HEMODYNAMIC DATA:  Central aortic pressure 185/99.  Left ventricular pressure 185/29.  ANGIOGRAPHIC DATA:  Left main coronary artery was angiographically normal and bifurcated into the LAD and circumflex system.  The LAD was calcified proximally.  There was high-grade 95% stenosis with mild haziness between the ostium and the first septal perforating artery.  There was 40% LAD stenosis after the first diagonal  vessel. After two additional septal perforating arteries in the mid distal portion, the LAD was occluded.  The circumflex vessel gave rise to a high marginal vessel that had diffuse 50% to 70% stenoses.  There was 80% stenosis in the AV groove portion of the circumflex.  The right coronary artery had diffuse 50% stenoses in the proximal midsegment.  It was totally occluded just prior to the acute margin. There also was 90% stenosis in the SA nodal artery.  The vein graft, which supplied the PDA and PLA sequentially was widely patent.  The vein graft which supplied the obtuse marginal vessel was widely patent.  Next, the vein graft which had supplied the diagonal vessel was occluded and this was old.  Next, a LIMA graft which supplies the distal LAD was widely patent; however, there was not any filling of the LAD proximal to the mid distal occlusion.  There also was collateralization to the diagonal vessel from the apical portion of the LAD.  Next, following percutaneous coronary prevention to the LAD proximally, the 95% stenosis was reduced to 0% with insertion of the 3.0 x 15-mm DES Resolute stent postdilated to 3.25 mm and there was brisk TIMI 3 flow.  IMPRESSION: 1. Moderate left ventricular dysfunction with an ejection fraction of     35% to 40% with evidence for moderate hypokinesis of the     anterolateral wall and apex and evidence for 2 to 3+ angiographic     mitral regurgitation. 2. Significant multivessel coronary artery disease with new 95%     calcified proximal LAD stenosis followed by 40% stenosis after the     first diagonal vessel and total occlusion of the mid LAD after 2     additional septal perforating arteries. 3. 50% to 70% stenosis in the first marginal branch of the circumflex     with 80% diffuse AV groove circumflex stenosis. 4. Diffuse RCA narrowing with 95% proximally, 50% mid, and then total     occlusion just proximal to the acute margin. 5. Patent  sequential vein graft supplying the PDA/PLA vessel of the     right coronary artery. 6. Patent vein graft supplying the obtuse marginal branch of the     circumflex. 7. Old occluded SVG which had previously supplied the diagonal vessel. 8. Patent left internal mammary artery graft to the distal left     anterior descending with collateralization to the diagonal system. 9. Successful percutaneous coronary intervention with percutaneous     transluminal coronary angioplasty and stenting of the left anterior     descending proximally (not supplied by the left internal mammary     artery graft)     with a 95% stenosis being reduced to 0% with ultimate insertion of  a 3.0 x 15-mm DES Resolute stent postdilated to 3.25 mm. 10.Bivalirudin/Plavix/IV Lopressor/IC and IV nitroglycerin in addition     to aspirin, Plavix.          ______________________________ Nicki Guadalajara, M.D.     TK/MEDQ  D:  01/08/2011  T:  01/09/2011  Job:  [id number]  cc:   Gerlene Burdock A. Alanda Amass, M.D. Jorja Loa, M.D. Madelin Rear. Sherwood Gambler, MD Janetta Hora Darrick Penna, MD  Electronically Signed by Nicki Guadalajara M.D. on 01/11/2011 05:05:01 PM

## 2011-01-15 ENCOUNTER — Emergency Department (HOSPITAL_COMMUNITY): Payer: Medicare Other

## 2011-01-15 ENCOUNTER — Emergency Department (HOSPITAL_COMMUNITY)
Admission: EM | Admit: 2011-01-15 | Discharge: 2011-01-15 | Disposition: A | Discharge status: Home / Self Care | Payer: Medicare Other | Attending: Emergency Medicine | Admitting: Emergency Medicine

## 2011-01-15 DIAGNOSIS — Z79899 Other long term (current) drug therapy: Secondary | ICD-10-CM | POA: Insufficient documentation

## 2011-01-15 DIAGNOSIS — I120 Hypertensive chronic kidney disease with stage 5 chronic kidney disease or end stage renal disease: Secondary | ICD-10-CM | POA: Insufficient documentation

## 2011-01-15 DIAGNOSIS — Z992 Dependence on renal dialysis: Secondary | ICD-10-CM | POA: Insufficient documentation

## 2011-01-15 DIAGNOSIS — M7989 Other specified soft tissue disorders: Secondary | ICD-10-CM | POA: Insufficient documentation

## 2011-01-15 DIAGNOSIS — Z7982 Long term (current) use of aspirin: Secondary | ICD-10-CM | POA: Insufficient documentation

## 2011-01-15 DIAGNOSIS — Z951 Presence of aortocoronary bypass graft: Secondary | ICD-10-CM | POA: Insufficient documentation

## 2011-01-15 DIAGNOSIS — I509 Heart failure, unspecified: Secondary | ICD-10-CM | POA: Insufficient documentation

## 2011-01-15 DIAGNOSIS — I214 Non-ST elevation (NSTEMI) myocardial infarction: Secondary | ICD-10-CM | POA: Insufficient documentation

## 2011-01-15 DIAGNOSIS — N186 End stage renal disease: Secondary | ICD-10-CM | POA: Insufficient documentation

## 2011-01-15 DIAGNOSIS — I2589 Other forms of chronic ischemic heart disease: Secondary | ICD-10-CM | POA: Insufficient documentation

## 2011-01-15 LAB — CK TOTAL AND CKMB (NOT AT ARMC)
Relative Index: 4.3 — ABNORMAL HIGH (ref 0.0–2.5)
Total CK: 174 U/L (ref 7–232)

## 2011-01-15 LAB — BASIC METABOLIC PANEL
Calcium: 9.5 mg/dL (ref 8.4–10.5)
Chloride: 102 mEq/L (ref 96–112)
GFR calc Af Amer: 10 mL/min — ABNORMAL LOW (ref >60)
GFR calc non Af Amer: 8 mL/min — ABNORMAL LOW (ref >60)
Glucose, Bld: 83 mg/dL (ref 70–99)
Potassium: 3.5 mEq/L (ref 3.5–5.1)

## 2011-01-15 LAB — TROPONIN I
Troponin I: 0.05 ng/mL (ref 0.00–0.06)
Troponin I: 0.06 ng/mL (ref 0.00–0.06)

## 2011-01-15 LAB — CBC
HCT: 34.5 % — ABNORMAL LOW (ref 39.0–52.0)
Hemoglobin: 12 g/dL — ABNORMAL LOW (ref 13.0–17.0)
Hemoglobin: 10.3 g/dL — ABNORMAL LOW (ref 13.0–17.0)
MCHC: 29.9 g/dL — ABNORMAL LOW (ref 30.0–36.0)
MCV: 104.9 fL — ABNORMAL HIGH (ref 78.0–100.0)
Platelets: 104 10*3/uL — ABNORMAL LOW (ref 150–400)
RDW: 17 % — ABNORMAL HIGH (ref 11.5–15.5)
RDW: 17.1 % — ABNORMAL HIGH (ref 11.5–15.5)
WBC: 7 10*3/uL (ref 4.0–10.5)

## 2011-01-15 LAB — DIFFERENTIAL
Lymphocytes Relative: 11 % — ABNORMAL LOW (ref 12–46)
Lymphs Abs: 0.8 10*3/uL (ref 0.7–4.0)
Monocytes Relative: 5 % (ref 3–12)
Neutro Abs: 5.7 10*3/uL (ref 1.7–7.7)
Neutrophils Relative %: 82 % — ABNORMAL HIGH (ref 43–77)

## 2011-01-15 LAB — POCT CARDIAC MARKERS
CKMB, poc: 3.6 ng/mL (ref 1.0–8.0)
Myoglobin, poc: >500 ng/mL (ref 12–200)
Troponin i, poc: <0.05 ng/mL (ref 0.00–0.09)

## 2011-01-18 ENCOUNTER — Ambulatory Visit (HOSPITAL_COMMUNITY): Payer: Medicare Other

## 2011-01-18 ENCOUNTER — Ambulatory Visit (HOSPITAL_COMMUNITY)
Admission: RE | Admit: 2011-01-18 | Discharge: 2011-01-18 | Disposition: A | Discharge status: Home / Self Care | Payer: Medicare Other | Source: Ambulatory Visit | Attending: Cardiovascular Disease | Admitting: Cardiovascular Disease

## 2011-01-18 DIAGNOSIS — I724 Aneurysm of artery of lower extremity: Secondary | ICD-10-CM | POA: Insufficient documentation

## 2011-01-19 LAB — CBC
MCHC: 33.1 g/dL (ref 30.0–36.0)
MCV: 105.9 fL — ABNORMAL HIGH (ref 78.0–100.0)
RBC: 3.09 MIL/uL — ABNORMAL LOW (ref 4.22–5.81)
RDW: 16.6 % — ABNORMAL HIGH (ref 11.5–15.5)

## 2011-01-19 LAB — COMPREHENSIVE METABOLIC PANEL
ALT: 62 U/L — ABNORMAL HIGH (ref 0–53)
AST: 49 U/L — ABNORMAL HIGH (ref 0–37)
CO2: 29 mEq/L (ref 19–32)
Calcium: 9.4 mg/dL (ref 8.4–10.5)
Creatinine, Ser: 4.16 mg/dL — ABNORMAL HIGH (ref 0.4–1.5)
GFR calc Af Amer: 18 mL/min — ABNORMAL LOW (ref >60)
GFR calc non Af Amer: 15 mL/min — ABNORMAL LOW (ref >60)
Glucose, Bld: 170 mg/dL — ABNORMAL HIGH (ref 70–99)
Sodium: 140 mEq/L (ref 135–145)
Total Protein: 6.2 g/dL (ref 6.0–8.3)

## 2011-01-19 LAB — DIFFERENTIAL
Lymphocytes Relative: 8 % — ABNORMAL LOW (ref 12–46)
Lymphs Abs: 0.6 10*3/uL — ABNORMAL LOW (ref 0.7–4.0)
Monocytes Relative: 6 % (ref 3–12)
Neutrophils Relative %: 85 % — ABNORMAL HIGH (ref 43–77)

## 2011-01-19 LAB — POCT CARDIAC MARKERS
CKMB, poc: 2.9 ng/mL (ref 1.0–8.0)
Myoglobin, poc: >500 ng/mL (ref 12–200)
Troponin i, poc: <0.05 ng/mL (ref 0.00–0.09)

## 2011-01-22 ENCOUNTER — Emergency Department (HOSPITAL_COMMUNITY): Payer: Medicare Other

## 2011-01-22 ENCOUNTER — Emergency Department (HOSPITAL_COMMUNITY)
Admission: EM | Admit: 2011-01-22 | Discharge: 2011-01-22 | Disposition: A | Discharge status: Home / Self Care | Payer: Medicare Other | Attending: Emergency Medicine | Admitting: Emergency Medicine

## 2011-01-22 DIAGNOSIS — R0989 Other specified symptoms and signs involving the circulatory and respiratory systems: Secondary | ICD-10-CM | POA: Insufficient documentation

## 2011-01-22 DIAGNOSIS — M7989 Other specified soft tissue disorders: Secondary | ICD-10-CM | POA: Insufficient documentation

## 2011-01-22 DIAGNOSIS — F29 Unspecified psychosis not due to a substance or known physiological condition: Secondary | ICD-10-CM | POA: Insufficient documentation

## 2011-01-22 DIAGNOSIS — I2510 Atherosclerotic heart disease of native coronary artery without angina pectoris: Secondary | ICD-10-CM | POA: Insufficient documentation

## 2011-01-22 DIAGNOSIS — Z951 Presence of aortocoronary bypass graft: Secondary | ICD-10-CM | POA: Insufficient documentation

## 2011-01-22 DIAGNOSIS — N186 End stage renal disease: Secondary | ICD-10-CM | POA: Insufficient documentation

## 2011-01-22 DIAGNOSIS — R0602 Shortness of breath: Secondary | ICD-10-CM | POA: Insufficient documentation

## 2011-01-22 DIAGNOSIS — R0609 Other forms of dyspnea: Secondary | ICD-10-CM | POA: Insufficient documentation

## 2011-01-22 DIAGNOSIS — I509 Heart failure, unspecified: Secondary | ICD-10-CM | POA: Insufficient documentation

## 2011-01-22 DIAGNOSIS — R609 Edema, unspecified: Secondary | ICD-10-CM | POA: Insufficient documentation

## 2011-01-22 DIAGNOSIS — R0601 Orthopnea: Secondary | ICD-10-CM | POA: Insufficient documentation

## 2011-01-22 DIAGNOSIS — Z992 Dependence on renal dialysis: Secondary | ICD-10-CM | POA: Insufficient documentation

## 2011-01-22 DIAGNOSIS — I120 Hypertensive chronic kidney disease with stage 5 chronic kidney disease or end stage renal disease: Secondary | ICD-10-CM | POA: Insufficient documentation

## 2011-01-22 DIAGNOSIS — Z79899 Other long term (current) drug therapy: Secondary | ICD-10-CM | POA: Insufficient documentation

## 2011-01-22 LAB — POCT CARDIAC MARKERS: Myoglobin, poc: 312 ng/mL (ref 12–200)

## 2011-01-22 LAB — CBC
MCH: 30.9 pg (ref 26.0–34.0)
MCHC: 29.1 g/dL — ABNORMAL LOW (ref 30.0–36.0)
MCV: 106 fL — ABNORMAL HIGH (ref 78.0–100.0)
Platelets: 202 10*3/uL (ref 150–400)
RBC: 2.82 MIL/uL — ABNORMAL LOW (ref 4.22–5.81)
RDW: 17.5 % — ABNORMAL HIGH (ref 11.5–15.5)

## 2011-01-22 LAB — BRAIN NATRIURETIC PEPTIDE: Pro B Natriuretic peptide (BNP): >3200 pg/mL — ABNORMAL HIGH (ref 0.0–100.0)

## 2011-01-22 LAB — DIFFERENTIAL
Basophils Relative: 1 % (ref 0–1)
Eosinophils Absolute: 0.1 10*3/uL (ref 0.0–0.7)
Eosinophils Relative: 1 % (ref 0–5)
Lymphs Abs: 1.3 10*3/uL (ref 0.7–4.0)
Monocytes Absolute: 0.8 10*3/uL (ref 0.1–1.0)
Monocytes Relative: 6 % (ref 3–12)

## 2011-01-22 LAB — BASIC METABOLIC PANEL
BUN: 26 mg/dL — ABNORMAL HIGH (ref 6–23)
Calcium: 9.7 mg/dL (ref 8.4–10.5)
Chloride: 99 mEq/L (ref 96–112)
Creatinine, Ser: 6.47 mg/dL — ABNORMAL HIGH (ref 0.4–1.5)

## 2011-01-23 NOTE — Procedures (Signed)
  NAMEJARQUAVIOUS, Frederick [name] NO.:  [id number]  MEDICAL RECORD NO.:  [id number]           PATIENT TYPE:  O  LOCATION:  [facility]                         FACILITY:  [facility]  PHYSICIAN:  Nanetta Batty, M.D.   DATE OF BIRTH:  1951-01-18  DATE OF PROCEDURE: DATE OF DISCHARGE:  01/18/2011                           CARDIAC CATHETERIZATION   Ultrasound-guided thrombin injection on a right common femoral pseudoaneurysm.  [name] is a 60 year old Caucasian male who recently underwent diagnostic coronary arteriography by Dr. Daphene Jaeger.  He was seen in the office yesterday with a painful right groin and had ultrasound interrogation revealing a pseudoaneurysm.  He was set up today by Dr. [place] Hilty for me to perform ultrasound-guided thrombin injection.  The patient was brought to the Short Stay Area C in the postabsorptive state.  His right groin was prepped sterilely.  He received 1 mg of Dilaudid IV.  The area underwent local anesthesia with 1% Xylocaine. Under ultrasound guidance, a 20-gauge LP needle was inserted into the pseudoaneurysm and its location was confirmed by injection of saline. Following this 1 mL of thrombin was then administered via the needle with ultrasound documentation in real time of thrombosis and lack of flow.  Popliteal segment remained patent throughout the case.  The patient will be discharged home in 2 hours and will see Dr. Alanda Amass back in 1-2 weeks.     Nanetta Batty, M.D.     JB/MEDQ  D:  01/18/2011  T:  01/19/2011  Job:  [id number]  cc:   [facility] Richard A. Alanda Amass, M.D. [place] Hilty, MD  Electronically Signed by Nanetta Batty M.D. on 01/23/2011 10:48:57 AM

## 2011-01-25 NOTE — Discharge Summary (Signed)
Frederick [name], Frederick [name] NO.:  [id number]  MEDICAL RECORD NO.:  [id number]           PATIENT TYPE:  I  LOCATION:  6731                         FACILITY:  [facility]  PHYSICIAN:  Nicki Guadalajara, M.D.     DATE OF BIRTH:  Feb 08, 1951  DATE OF ADMISSION:  01/07/2011 DATE OF DISCHARGE:  01/10/2011                              DISCHARGE SUMMARY   DISCHARGE DIAGNOSES: 1. Non-ST elevation myocardial infarction with emergent cath with new     stenosis to the left anterior descending coronary artery.     a.     Percutaneous transluminal coronary arteriography and stent      deployment to the left anterior descending coronary artery with a      Resolute stent. 2. Unresponsive at home and normal glucose, diaphoretic secondary to     myocardial infarction . 3. Ischemic cardiomyopathy with ejection fraction of 35-45% by cath.     By echo this admission, 40-50%. 4. 2-3+ mitral regurgitation by cath, but by echo, mild to moderate     mitral regurgitation. 5. End-stage renal disease on hemodialysis on Monday, Wednesday, and     Friday in [place].  6.  Hypertension. 6. Coronary artery disease with history of bypass grafting. 7. Respiratory distress secondary to heart failure.  After increased     dialysis, resolution of desaturation.  Room air oxygen saturation     98%. 8. Anemia with heme-positive stools, some chronic anemia due to renal     disease.  GI consult was obtained.  Recommended continuing PPI and     save EGD for more obvious GI bleed.  Please note Dr. Darrick Penna was very concerned about GI bleed and on new Plavix and aspirin.  We will need to monitor his hemoglobin and hematocrit closely.  DISCHARGE CONDITION:  Improved.  PROCEDURES:  Emergent cardiac catheterization on January 07, 2011 by Dr. Nicki Guadalajara.  January 07, 2011, PTCA and stent deployment to the LAD, reducing 95% stenosis to 0% with a Resolute Integrity stent.  DISCHARGE MEDICATIONS:  See those with [facility]  medication reconciliation sheet.  We did change lisinopril to Cozaar.  We will continue Plavix that he should not stop.  HISTORY OF PRESENT ILLNESS:  A 60 year old white male that lives with his mother has known coronary artery disease including bypass grafting on chronic renal failure, on hemodialysis, was found by his mother on the morning of January 07, 2011.  She called EMS at 9:30.  At that time was the first time he was not responding to her.  He had just prior to this time.  He had gone into the bathroom and became somewhat unresponsive.  EMS arrived.  EKG with some early pole versus ST elevation.  ST elevation MI was called.  EMS also did glucose, it was 120, blood pressure was elevated to 230/130.  He came to [facility] by EMS. He was extremely diaphoretic and hypertensive.  He was responding on arrival.  He had received 4-5 nitroglycerin in the field for his hypertension.  No chest pain, but due to the significant diaphoresis and his hypertension.  In the emergency room,  he continued to be short of breath, diaphoretic, and was incontinent of stool, which is not unusual for him per his mother.  He desaturated, he was placed on BiPAP.  His chest x-ray showed pulmonary edema.  He was started on IV nitroglycerin, which was titrated up quickly.  Dr. Briant Cedar was called for renal consult on the way to the cath lab.  He was also given 1 ampule of calcium chloride as well as aspirin in the emergency room.  PAST MEDICAL HISTORY:  Bypass grafting x5 in 2007.  On his last cath, LIMA to the LAD was patent.  The vein graft to the PDA and PLA were patent and the vein graft to the diagonal was occluded, but vein graft to the OM was patent.  His last nuclear study was April 2011.  There was intermittent risk, but clinically stable without angina, so medical therapy was continued.  EF in 2007 was 15%, but the most recent echo in February 2011 was 50-55%.  Other history includes left  carotid endarterectomy, resistant hypertension, end-stage renal disease on hemodialysis in [place].  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  He has dyslipidemia and the cath lab results were as above. He was admitted to the CCU Unit.  Renal consult was obtained immediately post cath and he underwent dialysis.  He was using BiPAP to maintain his oxygen saturation as well as acute pulmonary edema secondary to ischemia mediated and unable to diurese due to his acute renal failure.  His dialysis helped and he continued with his dialysis during hospitalization.  The patient continued to improve.  By January 08, 2011, no further chest pain.  IV nitro was weaned.  He was placed on Imdur daily.  Two-D echo was done here in the hospital with results as previously stated.  The patient was ambulating.  He did desat on January 09, 2011, but after dialysis again, he improved and was able to ambulate with oxygen saturation 98-100% at discharge.  The patient had positive stools and anemia.  GI consult was obtained. It was concerning with positive stools on new Plavix.  GI recommended to continue PPI, follow up with GI in [place] and proceed as needed.  At discharge, hemoglobin 9.8, hematocrit 32.4, WBC 6.3, platelets are 97,000.  Sodium 136, potassium 3.8.  The patient has had thrombocytopenia with platelets on admission 127,000, now 97,000. Hemoglobin on admission was 13.6 and hematocrit 40.  Other laboratory values, phosphorus 2, calcium 9.7.  Troponin was 1.17 today, peak troponin was 2.72, peak CK was 109 with an MB of 12.8.  BNP was done, was greater than 3200.  Total cholesterol 100, triglycerides 141, HDL 32, LDL 40.  TSH 1.068.  Iron 51-62, TIBC was 281, iron percent saturation 22, UIBC 219.  Vitamin B12 was 1492, serum folate was 11.4, and ferritin was 1318.  Hepatitis B surface antigen was negative and stool  was positive as stated.  MRSA screen was negative.  The patient will continue to have hemoglobin monitored as outpatient.  RADIOLOGY:  Chest x-ray on admission, pulmonary edema.  Follow up on January 08, 2011.  Progression of pulmonary edema, on January 09, 2011, improved edema and bilateral effusions.  Two-D echo as previously stated with EF 40-50%, moderate hypokinesis of the basal, mid, and anterior septal myocardium, grade 1 diastolic dysfunction, mitral valve with mild to moderate regurg, trivial pericardial effusion was identified.     Darcella Gasman.  Annie Paras, N.P.   ______________________________ Nicki Guadalajara, M.D.    LRI/MEDQ  D:  01/10/2011  T:  01/11/2011  Job:  [id number]  cc:   Gerlene Burdock A. Alanda Amass, M.D. Patrica Duel, M.D. Jorja Loa, M.D. Lionel December, M.D.  Electronically Signed by Nada Boozer N.P. on 01/12/2011 10:21:02 AM Electronically Signed by Nicki Guadalajara M.D. on 01/25/2011 10:53:20 AM

## 2011-02-19 ENCOUNTER — Emergency Department (HOSPITAL_COMMUNITY): Payer: Medicare Other

## 2011-02-19 ENCOUNTER — Inpatient Hospital Stay (HOSPITAL_COMMUNITY): Payer: Medicare Other

## 2011-02-19 ENCOUNTER — Inpatient Hospital Stay (HOSPITAL_COMMUNITY)
Admission: EM | Admit: 2011-02-19 | Discharge: 2011-02-21 | DRG: 291 | Disposition: A | Discharge status: Home / Self Care | Payer: Medicare Other | Attending: Otolaryngology | Admitting: Otolaryngology

## 2011-02-19 DIAGNOSIS — I509 Heart failure, unspecified: Secondary | ICD-10-CM | POA: Diagnosis present

## 2011-02-19 DIAGNOSIS — F17200 Nicotine dependence, unspecified, uncomplicated: Secondary | ICD-10-CM | POA: Diagnosis present

## 2011-02-19 DIAGNOSIS — D649 Anemia, unspecified: Secondary | ICD-10-CM | POA: Diagnosis present

## 2011-02-19 DIAGNOSIS — I5043 Acute on chronic combined systolic (congestive) and diastolic (congestive) heart failure: Principal | ICD-10-CM | POA: Diagnosis present

## 2011-02-19 DIAGNOSIS — I739 Peripheral vascular disease, unspecified: Secondary | ICD-10-CM | POA: Diagnosis present

## 2011-02-19 DIAGNOSIS — Z992 Dependence on renal dialysis: Secondary | ICD-10-CM

## 2011-02-19 DIAGNOSIS — N186 End stage renal disease: Secondary | ICD-10-CM | POA: Diagnosis present

## 2011-02-19 DIAGNOSIS — I120 Hypertensive chronic kidney disease with stage 5 chronic kidney disease or end stage renal disease: Secondary | ICD-10-CM | POA: Diagnosis present

## 2011-02-19 DIAGNOSIS — E785 Hyperlipidemia, unspecified: Secondary | ICD-10-CM | POA: Diagnosis present

## 2011-02-19 DIAGNOSIS — I2510 Atherosclerotic heart disease of native coronary artery without angina pectoris: Secondary | ICD-10-CM | POA: Diagnosis present

## 2011-02-19 LAB — CBC
MCH: 32.2 pg (ref 26.0–34.0)
MCV: 106.2 fL — ABNORMAL HIGH (ref 78.0–100.0)
Platelets: 241 10*3/uL (ref 150–400)
RDW: 17.1 % — ABNORMAL HIGH (ref 11.5–15.5)
WBC: 8.1 10*3/uL (ref 4.0–10.5)

## 2011-02-19 LAB — COMPREHENSIVE METABOLIC PANEL
BUN: 25 mg/dL — ABNORMAL HIGH (ref 6–23)
CO2: 32 mEq/L (ref 19–32)
Chloride: 94 mEq/L — ABNORMAL LOW (ref 96–112)
Creatinine, Ser: 5.98 mg/dL — ABNORMAL HIGH (ref 0.4–1.5)
GFR calc non Af Amer: 10 mL/min — ABNORMAL LOW (ref >60)
Total Bilirubin: 0.3 mg/dL (ref 0.3–1.2)

## 2011-02-19 LAB — PRO B NATRIURETIC PEPTIDE: Pro B Natriuretic peptide (BNP): >70000 pg/mL — ABNORMAL HIGH (ref 0–125)

## 2011-02-19 LAB — CARDIAC PANEL(CRET KIN+CKTOT+MB+TROPI)
Relative Index: INVALID (ref 0.0–2.5)
Troponin I: <0.3 ng/mL (ref <0.30)

## 2011-02-19 LAB — DIFFERENTIAL
Basophils Relative: 1 % (ref 0–1)
Eosinophils Absolute: 0.1 10*3/uL (ref 0.0–0.7)
Eosinophils Relative: 2 % (ref 0–5)
Lymphs Abs: 1.1 10*3/uL (ref 0.7–4.0)

## 2011-02-19 LAB — APTT: aPTT: 36 seconds (ref 24–37)

## 2011-02-19 LAB — TROPONIN I: Troponin I: <0.3 ng/mL (ref <0.30)

## 2011-02-20 ENCOUNTER — Inpatient Hospital Stay (HOSPITAL_COMMUNITY): Payer: Medicare Other

## 2011-02-20 LAB — CARDIAC PANEL(CRET KIN+CKTOT+MB+TROPI): CK, MB: 2 ng/mL (ref 0.3–4.0)

## 2011-02-20 LAB — MRSA PCR SCREENING: MRSA by PCR: NEGATIVE

## 2011-02-21 ENCOUNTER — Inpatient Hospital Stay (HOSPITAL_COMMUNITY): Payer: Medicare Other

## 2011-02-21 LAB — BASIC METABOLIC PANEL
Chloride: 96 mEq/L (ref 96–112)
GFR calc non Af Amer: 11 mL/min — ABNORMAL LOW (ref >60)
Glucose, Bld: 94 mg/dL (ref 70–99)
Potassium: 3.4 mEq/L — ABNORMAL LOW (ref 3.5–5.1)
Sodium: 137 mEq/L (ref 135–145)

## 2011-02-21 LAB — CBC
HCT: 32.4 % — ABNORMAL LOW (ref 39.0–52.0)
Hemoglobin: 9.7 g/dL — ABNORMAL LOW (ref 13.0–17.0)
MCV: 103.8 fL — ABNORMAL HIGH (ref 78.0–100.0)
RBC: 3.12 MIL/uL — ABNORMAL LOW (ref 4.22–5.81)
RDW: 17.1 % — ABNORMAL HIGH (ref 11.5–15.5)
WBC: 7.8 10*3/uL (ref 4.0–10.5)

## 2011-02-21 LAB — DIFFERENTIAL
Basophils Absolute: 0.1 10*3/uL (ref 0.0–0.1)
Eosinophils Relative: 1 % (ref 0–5)
Lymphocytes Relative: 15 % (ref 12–46)
Lymphs Abs: 1.2 10*3/uL (ref 0.7–4.0)
Neutro Abs: 5.5 10*3/uL (ref 1.7–7.7)
Neutrophils Relative %: 71 % (ref 43–77)

## 2011-02-22 NOTE — H&P (Signed)
NAME:  Frederick [name], Frederick [name] NO.:  [id number]  MEDICAL RECORD NO.:  [id number]           PATIENT TYPE:  I  LOCATION:  A306                          FACILITY:  [facility]  PHYSICIAN:  Thurmon Fair, MD     DATE OF BIRTH:  Mar 31, 1951  DATE OF ADMISSION:  02/19/2011 DATE OF DISCHARGE:  LH                             HISTORY & PHYSICAL   HISTORY OF PRESENT ILLNESS:  Frederick [name] is a 60 year old man with end- stage renal disease and complete anuria, on chronic hemodialysis, Tuesday, Thursday, and Saturday.  He also has extensive cardiac problems with moderate ischemic cardiomyopathy with a left ventricle ejection fraction of 35-40% and extensive history of coronary problems.  He has previously undergone bypass surgery in 2007 with a LIMA to the distal LAD and sequential vein graft to the PDA and PLV and vein graft to the OM branch of the left circumflex coronary artery, and a vein graft to the diagonal branch of the LAD.  He had a non-ST-segment elevation myocardial infarction in March 2012 and underwent placement of a drug- eluting stent (Resolute 3.0 x 15 mm by Frederick [name]).  The presentation at that time was with chest pain.  Significant comorbidities include peripheral vascular disease status post left carotid endarterectomy and with extensive atherosclerosis in the lower extremity arteries.  He has recently had a right common femoral artery pseudoaneurysm following his cardiac catheterization that was successfully treated with a thrombin injection.  He has a longstanding history of very volatile blood pressure and has previously stopped his medications because he was dizzy and lightheaded.  He presents to the [facility] with acute pulmonary edema and evidence of severe hypervolemia.  He weighed in at 94 kg which is roughly 11 kg heavier than his "dry weight."  As mentioned, he is completely anuric.  As far as I can tell, there have been no problems with  appropriateness of ultrafiltration at time of dialysis and he has been compliant with his dialysis sessions.  There seems to be a problem with restriction of intake between sessions of dialysis.  His blood pressure medications have been steadily increased to allow for increase in blood pressure that appears to be in turn due to hypervolemia.  At the time of consultation, the patient is seen while he is undergoing hemodialysis.  Prior to hemodialysis, his blood pressure was 186/103 to 201/108 mmHg.  Even after 8 L of ultrafiltration, his blood pressure remains high, albeit improved at 173/92 mmHg.  PAST MEDICAL HISTORY:  As listed above.  SOCIAL HISTORY:  He is divorced and lives with his mother.  He denies the use of alcohol or recreational drugs.  He continues to smoke.  ALLERGIES:  He is reported to be allergic to PENICILLIN.  HOME MEDICATIONS: 1. Losartan 100 mg daily. 2. Aspirin 81 mg daily. 3. Plavix 75 mg daily. 4. Amlodipine 2.5 mg daily. 5. Carvedilol 6.25 mg b.i.d. 6. Temazepam 50 mg at bedtime p.r.n. 7. Simvastatin 20 mg daily. 8. Azithromycin 250 mg daily recently started for "bronchitis.".  FAMILY HISTORY:  Noncontributory.  PHYSICAL EXAMINATION:  GENERAL:  He  is essentially in anasarca.  His eyelids are very puffy.  He has edema all the way up to his thighs bilaterally even after ultrafiltration.  The edema is soft and pitting and almost doughy.  He is somewhat pale.  He is alert, oriented, and in no distress. VITAL SIGNS:  His blood pressure is 173/92, his heart rate is 70 beats per minute, respiratory rate is 16, and he is afebrile.  He is lying almost entirely supine at about 30-degree head of bed elevation and looks quite comfortable. HEENT:  Mouth, Ears, nose, and oropharynx are unremarkable except the presence of palpebral edema. NECK:  The jugular venous pulsations are about 3 cm above the sternal angle.  The carotid pulses are brisk without delay and  there is a well- healed scar of left carotid endarterectomy.  I do not hear carotid bruits. LUNGS:  Clear anteriorly and there is no evidence of abnormal fremitus sounds to percussion or other signs of consolidation. CARDIOVASCULAR:  A well-healed sternotomy scar.  The apical impulse is laterally displaced and rather effaced.  Regular rate and rhythm. Normal first and second heart sound.  There is a loud S3.  There is a grade 1-2/6 holosystolic murmur heard mostly at the left sternal border and more likely representing tricuspid regurgitation.  The liver is not pulsatile. ABDOMEN:  Soft, nontender, and nondistended with any palpable masses. Bowel sounds are norma. EXTREMITIES:  2-3+ pitting edema into the thighs bilaterally. NEUROLOGICAL:  Does not show any gross focal deficits.  IMAGING:  His electrocardiogram shows sinus rhythm with left axis deviation, left anterior fascicular block and nonspecific preexisting ST- T segment changes.  The chest x-ray on arrival was compatible with pulmonary edema and cardiomegaly.  LABORATORY DATA:  The pertinent laboratory results include 3 sets of normal cardiac enzymes.  A pro-BNP level in excess of 70,000.  BUN of 25 and creatinine of 5.98.  Normal liver function tests, normal coagulation studies, and a hemoglobin of 9.9 with macrocytic indices and otherwise normal CBC.  FINDINGS/ASSESSMENT/PLAN: 1. Acute pulmonary edema.  This was clearly due to severe hypervolemia     in the setting of moderately severe ischemic cardiomyopathy.  He     has confirmed systolic and diastolic dysfunction, however, there     does not appear to be any reason to suspect there has been any     recent change in cardiac performance.  He is severely hypervolemic     on admission at least 20 pounds over his "dry weight." 2. Moderate-to-severe ischemic cardiomyopathy with estimated ejection     fraction of 35-40%. 3. Coronary disease status post recent non-ST-segment  elevation     myocardial infarction and placement drug-eluting stents to the     proximal left anterior descending with patent left internal mammary     artery to the left anterior descending and patent saphenous vein     graft to the posterior descending artery and posterolateral.  There     is no evidence of EKG changes, enzyme leak, or any angina and the     suspicion for acute coronary syndrome is extremely low. 4. Refractory hypertension.  I believe that the high blood pressure is     impossible to control because of severe hypervolemia.  I believe it     will be possible to better regulate his blood pressure once     dialysis is completed.  I think even a weight of 83 kg might be an  overestimation of his true dry weight.  The most likely cause of     his severe hypervolemia in a patient that is otherwise completely     anuric has to be noncompliance with fluid restriction.  The BUN and     creatinine levels are quite low for the patient on dialysis     suggesting that he gets appropriate clearance and is compliant with     dialysis.  I think he simply drinks too much fluids in between     sessions of dialysis.  He is educated regarding this.  He is     encouraged to weigh himself on a daily basis.  He should not gain     more than 5-6 pounds between sessions of dialysis. 5. Extensive peripheral vascular disease, currently asymptomatic. 6. Ongoing tobacco abuse.  It is strongly recommended in no uncertain     terms that he completely stop smoking.     Thurmon Fair, MD     MC/MEDQ  D:  02/20/2011  T:  02/21/2011  Job:  [id number]  cc:   Gerlene Burdock A. Alanda Amass, M.D. Fax: [phone]  Madelin Rear. Sherwood Gambler, MD Fax: [phone]  Dr. Delfin Edis E. Fields, MD [address], [place] [address]  Electronically Signed by Thurmon Fair M.D. on 02/22/2011 04:13:52 PM

## 2011-02-27 NOTE — Op Note (Signed)
Frederick [name], DILAURO NO.:  [id number]   MEDICAL RECORD NO.:  [id number]          PATIENT TYPE:  INP   LOCATION:  3307                         FACILITY:  [facility]   PHYSICIAN:  Janetta Hora. Fields, MD  DATE OF BIRTH:  November 26, 1950   DATE OF PROCEDURE:  05/17/2008  DATE OF DISCHARGE:                               OPERATIVE REPORT   PROCEDURE:  Left carotid endarterectomy.   PREOPERATIVE DIAGNOSIS:  High-grade left internal carotid artery  stenosis.   POSTOPERATIVE DIAGNOSIS:  High-grade left internal carotid artery  stenosis.   ANESTHESIA:  General.   ASSISTANT:  Wilmon Arms, PA, Di Kindle. Edilia Bo, MD   OPERATIVE FINDINGS:  1. Greater than 90% left internal carotid artery stenosis.  2. Dacron patch.  3. 10-French shunt.   OPERATIVE DETAILS:  After obtaining informed consent, the patient was  taken to the operating room.  The patient placed in supine position on  the operating room table.  After induction of general anesthesia, an  endotracheal intubation and Foley catheter was placed.  Next, the  patient's entire left neck and chest were prepped and draped in usual  sterile fashion.  An oblique incision was made on the left side of the  neck just anterior to the border of the left sternocleidomastoid muscle.  Incision was carried down through subcutaneous tissues into the  platysma.  Sternocleidomastoid muscle was reflected laterally.  Dissection was carried along the anterior border of the left internal  jugular vein.  Common facial vein was identified, dissected free  circumferentially, and ligated and divided between silk ties.  The  common carotid artery was then identified and dissected free  circumferentially.  Vagus nerve was identified and protected.  Umbilical  tape was placed around the common carotid artery.  Dissection was then  carried up to the level of the carotid bifurcation.  The superior  thyroid and external carotid arteries were  dissected free  circumferentially and vessel loops were placed around these.  Dissection  was then carried up on the left internal carotid artery.  The left  hypoglossal nerve was identified and protected.  Dissection was carried  circumferentially around the left internal carotid artery at an area  that appeared disease free on external examination.  Next, the patient  was given 7000 units of intravenous heparin.  Left internal carotid  artery was controlled distally with a vessel loop.  The external  superior thyroid arteries were also controlled with vessel loops and the  common carotid controlled with a peripheral DeBakey clamp.  A  longitudinal opening was made in the common carotid artery just below  the bifurcation.  Arteriotomy was extended proximally.  There was a 30%  stenosis in a more proximal common carotid approximately 3 cm from the  carotid bifurcation.  This was opened as well.  The arteriotomy was then  extended through the carotid bifurcation and up into the internal  carotid artery.  There was a high-grade left internal carotid artery  stenosis approximately 90%.  The arteriotomy was opened and passed to  the level of the disease.  Next,  a 10-French shunt was brought up in the  operative field and this was threaded in the distal internal carotid  artery and allowed to back bleed thoroughly.  It was then secured in  place with a vessel loop.  Shunt was then threaded down in the proximal  common carotid artery and controlled with a Rumel tourniquet.  Flow was  then reopened with restoration of flow to the brain through the shunt  after approximately 4 minutes.  Next, endarterectomy was begun under  suitable plane near the carotid bifurcation.  The external carotid  artery was endarterectomized by eversion technique.  The internal  carotid artery had a slightly tapered step-off in the intima.  Therefore, this was tacked with several 7-0 Prolene interrupted sutures.   Plaque was removed and sent to pathology as a specimen.  A good proximal  endpoint was obtained.  Dacron patch was then brought up in the  operative field and sewn as patch angioplasty using running 6-0 Prolene  suture.  Just prior to completion anastomosis, this was back bled and  thoroughly flushed.  Anastomosis was secured, flow was first restored up  the external carotid artery, then into the common carotid artery, and  finally up to the internal carotid artery approximately 5 cardiac  cycles.  There was some bleeding at the toe and this was repaired with a  single #7-0 Prolene suture.  Doppler was used to inspect the carotid  artery with good flow in the internal, external, and common carotid  arteries.  Hemostasis was obtained with thrombin Gelfoam.  Platysma was  reapproximated using running 3-0 Vicryl suture.  Skin was closed with 4-  0 Vicryl subcuticular stitch.  The patient tolerated the procedure well  with no complications.  Sponge and needle counts were correct at end of  the case.  The patient taken to recovery room in stable condition.  The  patient was moving upper extremities and lower extremities symmetrically  at the end of the case.      Janetta Hora. Fields, MD  Electronically Signed     CEF/MEDQ  D:  05/17/2008  T:  05/18/2008  Job:  [id number]

## 2011-02-27 NOTE — Op Note (Signed)
NAMELUE, DUBUQUE NO.:  [id number]   MEDICAL RECORD NO.:  [id number]          PATIENT TYPE:  AMB   LOCATION:  DAY                           FACILITY:  [facility]   PHYSICIAN:  Kassie Mends, M.D.      DATE OF BIRTH:  [birth year]   DATE OF PROCEDURE:  05/25/2009  DATE OF DISCHARGE:                               OPERATIVE REPORT   REFERRING PHYSICIAN:  Patrica Duel, M.D.   PROCEDURE:  Colonoscopy with cold forceps and snare cautery polypectomy.   INDICATED FOR EXAM:  Frederick [name] is a 60 year old male who presents for  average risk colon cancer screening.  He is being evaluated for kidney  transplant.   FINDINGS:  1. Multiple colon polyps removed in the cecum, proximal transverse      colon, distal transverse colon, descending colon, sigmoid and      rectum.  The polyps varied in size from 6 mm to 1.8 cm.  The polyps      were removed via cold forceps and snare cautery.  The cecal polyp      was removed via snare cautery using 20 watts.  The total number of      colon polyps were approximately 15.  Otherwise no masses,      inflammatory changes, diverticula or AVMs seen.  2. Normal retroflexed view of the rectum.   DIAGNOSES:  Multiple colon polyps.   RECOMMENDATIONS:  1. All first-degree relatives should have a screening colonoscopy at      age 82 and then every 5 years due to the number of polyps found in      Frederick [name].  2. Screening colonoscopy in 1 year due to the number polyps found in      his colon.  The procedure should be done on Monday, Wednesday, or      Friday because he has hemodialysis on Tuesday, Thursday and      Saturday.  3. Will call him with results of polyps.  4. No aspirin, NSAIDs or anticoagulation for 7-10 days.  No heparin      with dialysis until June 04, 2009.  5. He should follow a low residue diet for 3 days and then a high-      fiber diet.  He is given a handout on low-residue diet, high fiber      diet and polyps.   MEDICATIONS:  1. Demerol 100 mg IV.  2. Fentanyl 25 mcg IV.  3. Versed 6 mg IV.   PROCEDURE TECHNIQUE:  Physical exam was performed.  Informed consent was  obtained from the patient explaining benefits, risks and alternatives to  the procedure.  The patient was connected to monitor and placed in left  lateral position.  Continuous oxygen was provided by nasal cannula, IV  medicine administered through an indwelling cannula.  After  administration of sedation and rectal exam, the patient's rectum was  intubated and the scope was advanced under direct visualization to the  cecum.  Scope was removed slowly by carefully examining the color,  texture, anatomy and integrity of the mucosa on  the way out.  The  patient was recovered in endoscopy and discharged home in satisfactory  condition.   PATH:  MULTIPLE ADENOMATOUS POLYPS. NEEDS TO SEE a GENETICIST AND BE  TESTED FOR ATTENUATED AFP. Explained to pt some insurance companies will  pay for test and some will not.      Kassie Mends, M.D.  Electronically Signed     SM/MEDQ  D:  05/25/2009  T:  05/25/2009  Job:  [id number]   cc:   Patrica Duel, M.D.  Fax: [phone]

## 2011-02-27 NOTE — Assessment & Plan Note (Signed)
OFFICE VISIT   DARIAN, CANSLER  DOB:  23-Apr-1951                                       06/02/2008  ZOXWR#:[id number]   The patient returns for followup today after a left carotid  endarterectomy on August 3.  He has had no neurologic events since his  discharge from the hospital.  Overall he is doing well.   Blood pressure today was 182/95 in the right arm.  Left neck incision is  well-healed.  Upper extremity and lower extremity motor strength is 5/5.  Tongue is midline.  He has had no difficulty swallowing.  He is  continuing to take two 81 mg aspirin once a day and Plavix 75 mg once a  day.  He will follow up with me in 6 months' time for repeat carotid  duplex exam.   Janetta Hora. Fields, MD  Electronically Signed   CEF/MEDQ  D:  06/03/2008  T:  06/03/2008  Job:  [id number]   cc:   Dani Gobble, MD  Patrica Duel, M.D.

## 2011-02-27 NOTE — Assessment & Plan Note (Signed)
OFFICE VISIT   Frederick [name], Frederick [name]  DOB:  03-Feb-1951                                       11/17/2008  QIONG#:[id number]   The patient returns for followup today.  He underwent left carotid  endarterectomy August of 2009.  He has had no neurologic events since  then.  He denies any symptoms of TIA, amaurosis or stroke.  He states  overall he is doing well.  He is on chronic long-term hemodialysis.  Atherosclerotic risk factors include coronary artery disease,  hypertension, elevated cholesterol.  Unfortunately he continues to smoke  two cigarettes per day.  A lengthy discussion was held with the patient  regarding smoking cessation and his risk long-term of further coronary  artery disease and stroke related to his tobacco abuse.   He continues to take aspirin and Plavix.   PHYSICAL EXAM:  Blood pressure is 134/69 in the right arm, pulse is 73  and regular.  HEENT:  Unremarkable.  Neck:  Has 2+ carotid pulses  without bruit.  Chest:  Clear to auscultation.  Heart:  Exam is regular  rate and rhythm without murmur.  Neuro:  Exam shows symmetric upper  extremity and lower extremity motor strength which is 5/5.  He has an AV  fistula in the left upper arm which has an easily palpable thrill and  bruit.   He had a carotid duplex exam today which showed less than 40% right  internal carotid artery stenosis.  He had no evidence of restenosis on  the left side but did have an area of focal stenosis in the left common  carotid artery which was fairly mild in nature with a peak systolic  velocity of 211 cm/sec.   Overall the patient is doing well.  I again discussed with him today  smoking cessation as well as control of his other atherosclerotic risk  factors.  He will continue to take his Plavix and aspirin.  He will  follow up in our carotid protocol with intermittent carotid duplex  exams.   Janetta Hora. Fields, MD  Electronically Signed   CEF/MEDQ  D:   11/18/2008  T:  11/18/2008  Job:  [id number]   cc:   Patrica Duel, M.D.  Richard A. Alanda Amass, M.D.

## 2011-02-27 NOTE — Procedures (Signed)
CAROTID DUPLEX EXAM   INDICATION:  Followup carotid artery disease.   HISTORY:  Diabetes:  No.  Cardiac:  No.  Hypertension:  Yes.  Smoking:  Yes.  Previous Surgery:  Left CEA 05/17/2008 by Dr. Darrick Penna.  CV History:  No.  Amaurosis Fugax No, Paresthesias No, Hemiparesis No                                       RIGHT             LEFT  Brachial systolic pressure:                           Fistula  Brachial Doppler waveforms:         WNL               WNL  Vertebral direction of flow:        Flow barely visualized              Antegrade  DUPLEX VELOCITIES (cm/sec)  CCA peak systolic                   118               P=121, M=211  ECA peak systolic                   141               171  ICA peak systolic                   97                79  ICA end diastolic                   38                40  PLAQUE MORPHOLOGY:                  Heterogenous      Heterogenous  PLAQUE AMOUNT:                      Mild              No ICA stenosis  PLAQUE LOCATION:                    ICA               CCA   IMPRESSION:  1. Right ICA shows evidence of 20-39% stenosis.  2. Left ICA shows no evidence of restenosis status post CEA.  3. Left CCA mid shows evidence of focal stenosis with elevated      velocities of 211 cm/s compared to 121 cm/s just proximal to it.  4. Right vertebral artery flow was barely visualized, suggestive of      occlusion.  Left vertebral artery flow as antegrade.   ___________________________________________  Janetta Hora. Fields, MD   AS/MEDQ  D:  11/17/2008  T:  11/17/2008  Job:  [id number]

## 2011-02-27 NOTE — Assessment & Plan Note (Signed)
OFFICE VISIT   NYMIR, RINGLER  DOB:  [birth year]                                       05/12/2008  WJXBJ#:[id number]   Mr. Dupuis is a 60 year old male referred by Dr. Domingo Sep for  evaluation of asymptomatic carotid stenosis.  This was recently found  after evaluation of an asymptomatic bruit.  He has no previous history  of stroke, TIA or amaurosis.  He currently is on Plavix and aspirin.  He  has been on Plavix for approximately 1 month for coronary artery  disease.  He has end-stage renal disease and has been on dialysis for 2-  1/2 years.   PAST MEDICAL HISTORY:  Also remarkable for hypertension and elevated  cholesterol.  He is also a 1/2-pack per day smoker.  He denies history  of diabetes.   PAST SURGICAL HISTORY:  He had:  1. Coronary artery bypass grafting.  2. Cholecystectomy.  3. Appendectomy.  4. Tonsillectomy.   FAMILY HISTORY:  His father died of a stroke in his 75s, otherwise this  is unremarkable.   SOCIAL HISTORY:  Smoking history is as above.  He is single.  He does  not consume alcohol regularly.   REVIEW OF SYSTEMS:  He is 6 feet tall, 220 pounds.  CARDIAC, PULMONARY,  GI, RENAL VASCULAR, NEUROLOGIC, ORTHOPEDIC PSYCHIATRIC, ENT, HEMATOLOGIC  REVIEW OF SYSTEMS:  Were all negative.   MEDICATIONS:  1. Aspirin 81 mg once a day.  2. Plavix 75 mg once a day.  3. Carvedilol 12.5 mg once a day.  4. Trilipix 135 mg once a day in the evening.  5. Niaspan 500 mg once a day in the evening.  6. Lisinopril 10 mg 1 tablet twice a day.  7. Sensipar 30 mg 1 nightly 30 mg nightly.  8. Simvastatin 20 mg 1 p.o. nightly.   HE IS ALLERGIC TO PENICILLIN WHICH CAUSES SWELLING AND A RASH.   PHYSICAL EXAM:  Blood pressure is 190/93 in the right arm, he has a  fistula in the left arm, pulse is 71 and regular.  HEENT:  Unremarkable.  He has 2+ carotid pulses with a harsh left-sided  bruit.  There is no bruit appreciated on the right side.  CHEST:  Clear to auscultation.  CARDIAC EXAM:  Regular rate and rhythm without murmur.  ABDOMEN:  Soft, nontender, nondistended with no masses.  He has a well-  healed median sternotomy scar on the chest.  He has 2+ brachial and  radial pulses bilaterally.  He has 2+ femoral, popliteal, dorsalis pedis  and posterior tibial pulses bilaterally.  NEURO EXAM:  Shows symmetric upper extremity and lower extremity motor  strength which is 5/5.  He has a functioning left brachiocephalic AV  fistula which has areas of mild pseudoaneurysm in the mid section but  has an easily audible bruit and palpable thrill.   He had a carotid duplex exam dated April 01, 2008, at [facility].  This showed a peak systolic velocity on the left side of 502 cm per  second with an end-diastolic velocity of 271 cm per second.  This fell  into the 70-99% category by their criteria.  There was a less than 50%  stenosis on the right side.   I believe Mr. Judice would benefit from left carotid endarterectomy for  stroke prophylaxis.  We have scheduled this  for him next week.  We will  stop his Plavix today.  Risks, benefits, possible complications and  procedure details were explained to the patient including, but not  limited to, bleeding, stroke, cranial nerve injury, infection.  I also  explained to him that his risk of stroke would be reduced by surgical  intervention compared to medical management.  He understands and agrees  to proceed.  His carotid endarterectomy is scheduled for next week.   Janetta Hora. Fields, MD  Electronically Signed   CEF/MEDQ  D:  05/13/2008  T:  05/13/2008  Job:  [id number]   cc:   Patrica Duel, M.D.  Dani Gobble, MD

## 2011-02-27 NOTE — H&P (Signed)
Frederick [name], GUIDICE NO.:  [id number]   MEDICAL RECORD NO.:  [id number]          PATIENT TYPE:  INP   LOCATION:  3307                         FACILITY:  [facility]   PHYSICIAN:  Cecille Aver, M.D.DATE OF BIRTH:  [birth year]   DATE OF ADMISSION:  05/17/2008  DATE OF DISCHARGE:                              HISTORY & PHYSICAL   REASON FOR CONSULTATION:  The patient needs scheduled dialysis.   HISTORY OF PRESENT ILLNESS:  The patient is 60 year old male with end-  stage renal disease, who receives his dialysis on Tuesday, Thursday,  Saturday at [facility] in [place] and he was admitted this morning  for a left carotid endarterectomy.  The procedure was done this morning,  and the patient tolerated it well.  He will need his Tuesday dialysis  since he is admitted the hospital and not to be discharged until  tomorrow afternoon.   PAST MEDICAL HISTORY:  1. End-stage renal disease secondary to hypertension.  2. Coronary artery disease, on Plavix and aspirin, although his Plavix      has been temporarily stopped for the endarterectomy.  3. The patient is status post coronary artery bypass.  His last echo      in 2007, showed an ejection fraction of 25-35%.  4. Left carotid artery stenosis, status post endarterectomy today.  5. Hypertension.  6. History of cholecystectomy, appendectomy, and tonsillectomy.   ALLERGIES:  PENICILLIN.   HEMODIALYSIS REGIMEN:  Tuesday, Thursday, and Saturday at [facility]  in [place].   ESTIMATED DRY WEIGHT:  100 kg.   LENGTH:  4 hours.   ACCESS:  Left arm fistula.   HEMODIALYSIS MEDICATIONS:  1. EPO 5500 three times a week.  2. Zemplar 9 mcg IV.  3. Renagel 800 mg 5 tablets t.i.d. with meals.  4. Sensipar 30 mg p.o. daily.  5. The patient also received Venofer 50 mg weekly on Thursdays.  6. His heparin protocol is at 3000 bolus, then 600 per hour with      hemodialysis.   MEDICATIONS:  1. Trilipix 135 mg  daily.  2. Simvastatin 20 mg daily.  3. Sensipar 30 mg daily.  4. Plavix 75 mg daily, however, this is on hold and now to be      restarted until May 29, 2008, due to the surgery.  5. Coreg 12.5 mg b.i.d.  6. Niaspan 500 mg daily.  7. Lisinopril 10 mg b.i.d.  8. Aspirin 81 mg daily.  9. Colchicine 0.6 mg b.i.d.  10.Renagel 800 mg, take 5 tablets t.i.d. with meals.   SOCIAL HISTORY:  [place]  He is currently on disability.  He is single.  Up until  recently, he smoked a half-pack of cigarettes per day, but plans on  quitting after this hospitalization.  No alcohol and no drug use  currently.   FAMILY HISTORY:  Significant for coronary artery disease, hypertension,  and diabetes.   REVIEW OF SYSTEMS:  Positive for weight loss, reflux, and anxiety.  Negative for fevers, chills, sweats, appetite, fatigue, headache, nasal  discharge  or bleed, vertigo, photophobia, vision loss, rashes, chest  pain, shortness of breath, dyspnea on exertion, orthopnea, edema, cough,  wheezing, heat or cold intolerance, urinary frequency, urgency, or  dysuria, hematuria, nocturia, weakness, numbness, depression,  arthralgias, joint swelling, nausea, vomiting, diarrhea, bright red  blood per rectum, melena, hematemesis, odynophagia, abdominal pain, or  change in bowel habits.   PHYSICAL EXAMINATION:  VITAL SIGNS:  Temperature 97.2, pulse 61-68,  respirations 14-18, and blood pressure 142-158 over 60-67.  The patient  is sating 100% on 2 L nasal cannula.  GENERAL:  The patient is in no acute distress.  He is alert and oriented  x3.  HEENT:  Head is normocephalic and atraumatic.  Extraocular movements are  intact.  His sclera is clear.  NECK:  Supple.  No JVD, no bruit on the right.  His left neck is covered  with a bandage from the endarterectomy.  HEART:  Regular rate and rhythm with normal S1 and S2.  I cannot  appreciate any murmurs or  rubs.  Radial pulses were 2+ and equal.  GASTROINTESTINAL:  Abdomen is soft, nontender, and nondistended.  Normoactive bowel sounds.  No rebound or guarding.  No  hepatosplenomegaly.  EXTREMITIES:  There is no clubbing, cyanosis, or edema.  Access, he has  a left AV fistula in the left arm with a positive bruit.  NEUROLOGIC:  He is alert and oriented x3.  Cranial nerves II through XII  are grossly intact.   LABORATORY DATA:  From this morning preoperatively include a hemoglobin  of 12.6 and hematocrit 37.0.  Sodium 135, potassium 3.4, and glucose 91.   ASSESSMENT AND PLAN:  The patient is 60 year old with end-stage renal  disease, needing hemodialysis tomorrow.   1. End-stage renal disease.  Orders for hemodialysis for tomorrow.      Will try to do on the first shift in case he is able to discharge      home tomorrow afternoon and do not plan on checking labs since he      is likely to go home tomorrow, and this is a one-time dialysis.  He      will follow up with his regular dialysis center on Thursday.  2. Hypertension.  Will continue his home medications and continue his      medications with hemodialysis on May 18, 2008.  3. Anemia.  The patient was 12.6 this a.m.  He will get Epogen with      dialysis as normally scheduled.  4. Secondary hyperparathyroidism.  Will continue his Sensipar,      Renagel, and Zemplar.  5. Coronary artery disease.  That will be managed per primary team.      Asher Muir, MD  Electronically Signed     ______________________________  Cecille Aver, M.D.    SO/MEDQ  D:  05/17/2008  T:  05/18/2008  Job:  [id number]

## 2011-02-27 NOTE — Discharge Summary (Signed)
Frederick [name], Frederick [name] NO.:  [id number]   MEDICAL RECORD NO.:  [id number]          PATIENT TYPE:  INP   LOCATION:  3307                         FACILITY:  [facility]   PHYSICIAN:  Janetta Hora. Fields, MD  DATE OF BIRTH:  [birth year]   DATE OF ADMISSION:  05/17/2008  DATE OF DISCHARGE:  05/18/2008                               DISCHARGE SUMMARY   DISCHARGE DIAGNOSES:  1. Left internal carotid arterial stenosis.  2. End-stage renal disease, on hemodialysis.  3. Dyslipidemia.  4. Hypertension.   PROCEDURES PERFORMED:  Left carotid endarterectomy, May 17, 2008.   COMPLICATIONS:  None.   DISCHARGE MEDICATIONS:  He is instructed to resume his preoperative  medications consisting of:  1. Triplex 135 mg daily.  2. Simvastatin 20 mg daily.  3. Sensipar 30 mg daily.  4. Plavix 75 mg daily.  5. Coreg 12.5 mg p.o. b.i.d.  6. Niaspan 500 mg daily.  7. Lisinopril 10 mg p.o. b.i.d. with dialysis.  8. Aspirin 81 mg p.o. daily.  9. Percocet 5/325 one p.o. q.4 h p.r.n. pain, total #20 were given.   CONDITION ON DISCHARGE:  Stable and improving.   DISPOSITION:  He is being discharged home in stable condition with his  wounds healing well.  He is instructed to clean the wound with soap and  warm water.  He may shower starting May 19, 2008.  He is instructed to  observe his wound for signs of infection.  He is to return to see Dr.  Darrick Penna in 2 weeks and our office will call with the appointment.   BRIEF IDENTIFYING STATEMENT:  Complete details, please refer the typed  history and physical.  Briefly, this very pleasant 60 year old gentleman  was evaluated by Dr. Darrick Penna for left carotid stenosis.  Dr. Darrick Penna found  him to be a suitable candidate for carotid endarterectomy.  He was  informed of the risks and benefits of the procedure and after careful  consideration, he elected to proceed with surgery.   HOSPITAL COURSE:  Preoperative workup was completed as an outpatient.  He  was brought in through same-day surgery.  He underwent the  aforementioned left carotid endarterectomy.  The procedure was without  complications.  He was returned to the Post Anesthesia Care Unit.  Following stabilization, he was transferred to a bed on a surgical  step-down unit.  He was observed overnight.  The following morning, he  was desirous of discharge.  He was neurologically intact.  His tongue  was midline.  His smile was symmetrical.  He was receiving dialysis.  He  is to be discharged following dialysis and ambulation.      Wilmon Arms, PA      Janetta Hora. Fields, MD  Electronically Signed    KEL/MEDQ  D:  05/18/2008  T:  05/18/2008  Job:  [id number]   cc:   Janetta Hora. Darrick Penna, MD

## 2011-03-02 NOTE — Op Note (Signed)
Frederick [name], Frederick [name]               ACCOUNT NO.:  [id number]   MEDICAL RECORD NO.:  [id number]          PATIENT TYPE:  AMB   LOCATION:  SDS                          FACILITY:  [facility]   PHYSICIAN:  Quita Skye. Hart Rochester, M.D.  DATE OF BIRTH:  1951/08/17   DATE OF PROCEDURE:  12/21/2005  DATE OF DISCHARGE:                                 OPERATIVE REPORT   PREOPERATIVE DIAGNOSIS:  End stage renal disease.   POSTOPERATIVE DIAGNOSIS:  End stage renal disease.   OPERATION:  Creation of a left upper arm brachial artery to cephalic vein  arteriovenous fistula (Kauffman shunt).   SURGEON:  Edmund Holcomb. Hart Rochester, M.D.   FIRST ASSISTANT:  Nurse.   ANESTHESIA:  Local.   PROCEDURE:  The patient was taken to the operating room, placed in supine  position, at which time the left upper extremity was prepped Betadine scrub  solution was routine sterile manner. After infiltration of 1% Xylocaine with  epinephrine, a transverse incision was made in the antecubital area, the  antecubital vein dissected free. The cephalic branch was approximately 3 mm  in size and felt to be satisfactory for a fistula consistent with  preoperative vein mapping. After mobilizing the cephalic vein and preserving  the basilic vein, the vein was ligated distally and transected.  The  brachial artery was exposed and encircled with vessel loops, it had an  excellent pulse. 3000 units of heparin were given intravenously.  The artery  was occluded proximally and distally, opened with a 15 blade, extended with  Potts scissors. The vein was carefully spatulated and anastomosed end-to-  side with 6-0 Prolene. The vessel loops were then released and there was an  excellent pulse and thrill in the fistula. No protamine was given.  The  wounds were irrigated with saline and closed in layers with Vicryl  in a  subcuticular fashion. Sterile dressing applied. The patient taken to  recovery in satisfactory condition.     ______________________________  Quita Skye Hart Rochester, M.D.     JDL/MEDQ  D:  12/21/2005  T:  12/21/2005  Job:  [id number]

## 2011-03-02 NOTE — H&P (Signed)
Frederick [name], Frederick [name] NO.:  [id number]   MEDICAL RECORD NO.:  [id number]          PATIENT TYPE:  INP   LOCATION:  A224                          FACILITY:  [facility]   PHYSICIAN:  Patrica Duel, M.D.    DATE OF BIRTH:  [birth year]   DATE OF ADMISSION:  11/28/2005  DATE OF DISCHARGE:  LH                                HISTORY & PHYSICAL   CHIEF COMPLAINT:  Swelling and weakness.   HISTORY OF PRESENT ILLNESS:  This is 60 year old male with a history of  longstanding, poorly controlled hypertension.  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  He also has longstanding gout and  intermittent Indocin use.  He is status post appendectomy, tonsillectomy.   The patient presented to the office today with a two-week history of  increasingly severe edema of his lower extremities and weakness.  He was  found to be hypertensive with a blood pressure approximately 200/110.  Physical examination otherwise remarkable for 3+ edema of the lower  extremities.  He had no other significant physical findings at that time.  Perusal of the chart revealed the presence of a creatinine of 2.6 in 2003.  He had not been seen in the office since that time.   The patient was sent immediately to the lab for blood work.  He was found to  have creatinine of 6.4 and a BUN of 75.  He did not have significant  hypokalemia or metabolic acidosis.  Hemogram marginal but not grossly  abnormal.  Urinalysis revealed 4+ protein.  The patient was sent immediately  to the hospital for further evaluation and treatment of renal failure.   There is no history of chest pain, significant shortness of breath.  He has  had mild anorexia but no frank nausea, vomiting, diarrhea, melena,  hematemesis, or hematochezia or genitourinary symptoms.  He continues to  make moderate amounts of urine.  He has had no neurologic deficits or  headache.   CURRENT MEDICATIONS:  1.  Ziac 2.5 daily.  2.  Indocin  p.r.n.   ALLERGIES:  PENICILLIN.   PAST MEDICAL HISTORY:  As noted above.   FAMILY HISTORY:  Noncontributory.   REVIEW OF SYSTEMS:  As mentioned.   SOCIAL HISTORY:  The patient does smoke cigarettes.  Former use of alcohol.   PHYSICAL EXAMINATION:  GENERAL:  Very pleasant, fully alert male, who has  moderate pallor.  VITAL SIGNS:  Heart rate is approximately 70.  Blood pressure 200/110.  He  is afebrile.  HEENT:  Normocephalic, atraumatic.  Pupils are equal.  ENT benign.  NECK:  Supple.  JVD is present but no masses, thyromegaly, lymphadenopathy  or bruits.  LUNGS:  Clear to auscultation and percussion.  HEART:  Heart sounds are essentially normal.  There is an accentuated S2  present.  ABDOMEN:  Moderately distended but nontender.  Bowel sounds are intact. No  tenderness, organomegaly or masses noted.  EXTREMITIES:  2-3+ edema.  Peripheral pulses are not palpable due to edema.  Homan's sign is negative.  Calves are nontender.  There is no erythema  noted.  NEUROLOGIC:  Nonfocal.   ASSESSMENT:  Renal failure which is most likely chronic but consider  component of acute tubal necrosis, non-steroidal use or other.  Significant  hypertension of longstanding definitely a factor.  He has significant  proteinuria at this time.   PLAN:  Stat clonidine and Lasix administration.  Dr. Kristian Covey will assist in  management of this patient.  An ultrasound has been ordered and is currently  pending.  Will follow and treat expectantly.      Patrica Duel, M.D.  Electronically Signed     MC/MEDQ  D:  11/28/2005  T:  11/28/2005  Job:  [id number]

## 2011-03-02 NOTE — Discharge Summary (Signed)
Frederick [name], BRACCO NO.:  [id number]   MEDICAL RECORD NO.:  [id number]          PATIENT TYPE:  INP   LOCATION:  2038                         FACILITY:  [facility]   PHYSICIAN:  Evelene Croon, M.D.     DATE OF BIRTH:  [birth year]   DATE OF ADMISSION:  DATE OF DISCHARGE:  05/10/2006                                 DISCHARGE SUMMARY   ADMISSION DIAGNOSIS:  Three vessel coronary artery disease with moderate  left ventricular dysfunction.   DISCHARGE DIAGNOSES:  1. Three vessel coronary artery disease status post coronary artery bypass      grafting x5.  2. Poorly controlled hypertension.  3. End-stage renal disease on hemodialysis.  4. History of Streptococcus glomerulonephritis and __________  .  5. Long-standing gout and intermittent Indocin use.  6. Status post appendectomy and tonsillectomy.   CONSULTS:  On May 08, 2006 nephrology was consulted.   PROCEDURE:  On May 08, 2006 the patient underwent a mediastinotomy,  extracorporeal circulation, coronary artery bypass graft surgery x5 using  left internal mammary artery graft to the left anterior descending coronary,  with a saphenous vein graft to the diagonal branch of the LAD, saphenous  vein graft to the obtuse marginal branch of the left circumflex, and a  sequential saphenous vein graft to the posterior descending and  posterolateral branch of the right coronary.  Endoscopic vein harvesting  from the right leg.  Insertion of the left femoral tube line for blood  pressure monitoring by Dr. Evelene Croon.   HISTORY AND PHYSICAL:  Frederick [name] was evaluated in the office coronary  artery bypass graft surgery.  The patient is a 60 year old gentleman with a  history of poorly controlled hypertension resulting in end-stage renal  failure requiring hemodialysis in February 2007.  Patient is hoping to  eventually have a kidney transplant.  The patient was recently noted to have  an abnormal electrocardiogram, and  underwent a 2D echocardiogram showing an  ejection fraction of 35-45% with global hypokinesis.  He with noted to have  moderate mitral regurgitation, moderate tricuspid regurgitation.  Patient  subsequently underwent a Cardiolite scan, which showed no evidence of  ischemia.  The patient was noted to have an ejection fraction of 38% on that  study.  Given patient's left ventricular dysfunction, it was felt that he  should undergo cardiac catheterization to try to delineate his coronary  anatomy and try to delineate coronary artery disease.  Cardiac  catheterization was performed on April 19, 2006, which showed significant 2-  vessel coronary artery disease with ejection fraction of 25-40% with  inferior and posterior basil hypokinesis.  The patient's left main had no  significant disease.  The LAD had 40% proximal stenosis, and 70-80%  mid  vessel stenosis after moderate-size diagonal branch.  There is a focal 80%  distal stenosis.  Diagonal branch had about 90% stenosis in its largest  branch.  The left circumflex showed small vessel that was giving off 1  marginal vessel.  This had about 60% stenosis.  Right coronary artery was a  large dominant vessel that had  about 90% proximal and 60% diffuse distal  disease.  There is a posterior descending that had 90% osteal stenosis, and  posterolateral that had about 80% stenosis.  It was felt that the patient  undergo coronary artery bypass grafting for treatment of further ischemia  and infarction, and to prevent further deterioration of his left ventricular  function.  Patient voluntarily admitted himself on May 08, 2006 to undergo  this procedure, the risks and benefits were explained in great detail, and  he has agreed to proceed.   HOSPITAL COURSE:  Postoperatively, the patient has progressed well and as  expected.  Nephrology was consulted on May 08, 2006 due to his history of  renal disease.  The patient's first hemodialysis treatment  after surgery was  on May 09, 2006, which he tolerated well.  The patient did have some postop  anemia, and had 1 unit of blood transfused, and hemodialysis.  The patient  was scheduled again for hemodialysis on May 11, 2006.   The patient did experience some postop anemia.  As stated before, he  received 1 packed red blood cells on May 09, 2006 and hemodialysis.  The  patient was also given Aranesp and hemodialysis.  A heme check for stools  was scheduled for May 11, 2006, the results are pending at this time.  The  patient has been remaining stable with the hemoglobin ranging from 8.0-9.4.   Patient has experienced some volume overload.  His weight is currently  105.1, and preoperatively it was 98.5.  Patient does have poor control of  his blood pressure postoperatively.  Blood pressure has been ranging in the  140s and 150s/60s.  The patient's previous dose of Labetalol was 200 mg  b.i.d., on May 11, 2006 this Labetalol was increased to 400 mg b.i.d. by  cardiology, we will continue to monitor his blood pressure.   The patient is being weaned off his oxygen.  He was transferring to 2000  appropriately on May 10, 2006.  The patient is ambulating with cardiac  rehab fairly well.  The patient will be discharged in the next 1-2 days  provided he remains stable.  The patient has remained in normal sinus rhythm  at about 65 beats per minute on telemetry.   PHYSICAL EXAMINATION:  VITAL SIGNS:  152/63, heart rate 51, respirations 18,  temp 97.2, O2 sat is 96% on room air.  Input and output is 300+, weight 105  (preop weight 98.5).  Telemetry shows normal sinus rhythm at 65 beats per  minute.  CARDIOVASCULAR:  Regular rate and rhythm.  RESPIRATORY:  Decreased breath sounds at bases bilaterally.  ABDOMEN:  Soft, nontender and nondistended.  Bowel sounds positive.  EXTREMITIES:  1+ edema.  Good pulses bilaterally.  Incisions clear, dry and  intact.  Labs show a white blood cell count  of 12.0, hemoglobin 8.0, hematocrit 24.5,  platelet count 188,000.  BMP showed a sodium of 134, potassium 3.9, chloride  99, CO2 26, BUN 30, creatinine 6.4 and a glucose of 82.   DISCHARGE CONDITION:  The patient will be discharged to home in stable  condition.   MEDICATIONS:  1. Labetalol 400 mg b.i.d.  2. Aspirin 325 mg p.o. daily.  3. Tylox 1-2 every 4 hours p.r.n.  4. __________  daily.  5. Amitriptyline 25 mg p.o. q.h.s.  6. Lexapro 10 mg p.o. daily.  7. Renagel 3200 mg p.o. t.i.d. with meals.  8. Norvasc 10 mg p.o. q.h.s.  9. Clonidine 0.2  mg b.i.d.   INSTRUCTIONS:  Patient is instructed to follow a low-fat, low-salt renal  diet.  He is to do no driving or heavy lifting greater than 10 pounds for 3  weeks.  Patient is to ambulate 2-4 times daily, and increase activity as  tolerated.  He is instructed to continue his breathing exercises.  Patient  may shower and clean his wounds with mild soap and water.  The patient can  call the office if any wound problems should arise such as erythema,  drainage, temp greater than 101.5.  Patient is instructed on smoking  cessation.   FOLLOWUP:  The patient has a follow-up appointment with Dr. Laneta Simmers in 3  weeks, the office will call him with the time and date of the appointment.  Prior to seeing Dr. Laneta Simmers, he will have chest x-ray taken at [facility].  The patient is to call Dr. Kandis Cocking office for an  appointment in 2 weeks.  The number is [phone].      Constance Holster, [place]      Evelene Croon, M.D.  Electronically Signed    JMW/MEDQ  D:  05/11/2006  T:  05/12/2006  Job:  [id number]   cc:   Evelene Croon, M.D.  Richard A. Alanda Amass, M.D.  Dani Gobble, MD

## 2011-03-02 NOTE — Consult Note (Signed)
Frederick [name], BABLER               ACCOUNT NO.:  [id number]   MEDICAL RECORD NO.:  [id number]          PATIENT TYPE:  INP   LOCATION:  A224                          FACILITY:  [facility]   PHYSICIAN:  Jorja Loa, M.D.DATE OF BIRTH:  06-17-1951   DATE OF CONSULTATION:  11/28/2005  DATE OF DISCHARGE:                                   CONSULTATION   REASON FOR CONSULTATION:  Renal insufficiency.   HISTORY OF PRESENT ILLNESS:  Frederick [name] is a 60 year old gentleman who has  past medical history of hypertension, history of acute glomerulonephritis in  1972 secondary to a strep infection, history of gout, presently admitted  because of increased shortness of breath, uncontrolled hypertension, and  when blood work was done, the patient was found to have elevated creatinine  of 6.4 and BUN of 75.  Hence, admitted to the hospital, and consult is  called.  According to Frederick [name], he denied any problems until recently.  Overall, his appetite was slightly low.  He had some nausea for a weeks or  months, and a couple of weeks ago he started having he swelling of his legs.  He was seen by Dr. Nobie Putnam because of high blood pressure during that time,  and he was started on hypertensive medications.  After he went home, he did  not feel better, hence, he went to his office where blood work was done,  which showed very significant elevated BUN and creatinine.  Hence, admitted  to the hospital for further workup.  At this moment, the patient denies any  vomiting, but he has occasional nausea, and also his appetite is not great,  but he tried to loose weight.  According to the patient, within the last  year, he has lost about 50 pounds, but he did __________ from 3-2 at that  time because of difficulty in eating.  At this moment, he had orthopnea.  No  shortness of breath while sleeping.  He denies any chest pain.   PAST MEDICAL HISTORY:  1.  Hypertension for some time.  2.  History of acute  glomerulonephritis in 1972 secondary to strep      infection.  He was followed up by [facility].  3.  History of gout.  4.  History of GERD.  5.  History of leg swelling.  6.  History of overweight which has improved according to him.   PAST SURGICAL HISTORY:  1.  Status post tonsillectomy.  2.  Status post cholecystectomy.   MEDICATIONS:  1.  Clonidine 0.2 mg.  2.  Norvasc 5 mg p.o. daily.  3.  He had received Lasix 14 mg.  4.  Indocin 1-2 tablets for many years.  5.  Previously he was on Allopurinol.   SOCIAL HISTORY:  The patient smokes about 3-4 packs a day.   REVIEW OF SYSTEMS:  Overall, appears okay.  He denies any shortness of  breath now.  Appetite is okay.  He has some occasional nausea, which  improves with antacids intake.  No chest pain.  No urgency according to him,  he goes to  the bathroom without any issue.  He has swelling of the legs.   PHYSICAL EXAMINATION:  GENERAL APPEARANCE:  The patient is alert, in no  apparent distress.  VITAL SIGNS:  Temperature 97.7, pulse 73, blood pressure 195/123,  respirations 20.  HEENT:  No conjunctival pallor.  Nonicteric.  Oral mucosa seems to be moist.  NECK:  He has a little bit of JVD.  CHEST:  Decreased breath sounds.  Otherwise, no rales, rhonchi or egophony.  HEART:  Regular rate and rhythm.  No murmurs.  ABDOMEN:  Soft, positive bowel sounds.  EXTREMITIES:  No edema.   LABORATORY DATA:  His blood work which was sent from Dr. Geanie [name] office:  White blood cell count 11.1, hemoglobin 11.9, hematocrit 77.7.  His  potassium is 3.6, sodium 137, chloride 97, BUN 75, creatinine 6.4, albumin  3.1, calcium 8.4.  Chest x-ray showed cardiomegaly, but no evidence of heart  failure.  He had carotid Doppler in 2003 which showed mild plaque formation  on the left internal carotid artery, but no significant stenosis.  At this  moment, he has ultrasound.  Result is pending.  No additional blood work.   ASSESSMENT:   1.  Renal insufficiency.  At this moment, not sure whether this is acute on      chronic, as patient has previous history of acute gene which has      probable recovered.  I am not sure whether this is because of the      previous insult.  At this moment, the patient has multiple reasons for      renal failure which include uncontrolled hypertension, and also      nonsteroidal-induced renal failure.  However, at this moment, I am not      sure whether acute or chronic.  Since the patient was having some      decreased appetite, nausea, weight loss, this could be from uremic      symptoms.  If that is the case, the renal failure may be of a chronic      nature.  2.  Hypertension.  The patient was only taking Ziac.  His blood pressure is      not controlled.  Presently, he was started on Clonidine and Norvasc, but      the patient did not take his medications still.  3.  History of gout.  He was previously on Allopurinol.  He stopped taking      it about three years ago.  He was taking only Indocin.  No sign of acute      gout.  4.  History of GERD.  5.  History of leg swelling.  At this moment, there is presence of leg edema      and hypoalbuminemia.  Not sure whether this is nephrotic syndrome.  As      stated above, we do not have any thing to compare whether the patient      has nephrotic range proteinuria.  However, if he has, probably this is      consistent with nephrotic syndrome.  This could be related to      __________.  It could be also from other source or cause of his renal      failure.  6.  History of hypertension, uncontrolled.   RECOMMENDATIONS:  Will start him on IV insulin 100 cc per hour.  Will  increase his Lasix to 120 mg IV.  Will see his ultrasound.  Will check  his  UA.  Will also do __________ and phosphorus level.  Will also check his uric  acid.  Will follow the patient.  I have discussed with him, if patient retains fluid, and if we cannot get rid of it with  diuretics, probably may  need to consider dialysis.  The patient seems to agreeable with idea.     Jorja Loa, M.D.  Electronically Signed    BB/MEDQ  D:  11/28/2005  T:  11/29/2005  Job:  [id number]

## 2011-03-02 NOTE — Procedures (Signed)
NAMEFERGUS, THRONE NO.:  [id number]   MEDICAL RECORD NO.:  [id number]          PATIENT TYPE:  INP   LOCATION:  A212                          FACILITY:  [facility]   PHYSICIAN:  Edward L. Juanetta Gosling, M.D.DATE OF BIRTH:  09-20-1951   DATE OF PROCEDURE:  10/09/2006  DATE OF DISCHARGE:  10/10/2006                              EKG INTERPRETATION   The rhythm is sinus rhythm with first-degree AV block.  There is an  intraventricular conduction delay.  T wave abnormalities are seen  laterally, which could indicate ischemia and clinical correlation is  suggested.  Q-T interval is prolonged.  Abnormal electrocardiogram.      Oneal Deputy. Juanetta Gosling, M.D.  Electronically Signed     ELH/MEDQ  D:  10/10/2006  T:  10/10/2006  Job:  [id number]

## 2011-03-02 NOTE — Discharge Summary (Signed)
Frederick [name], DOCKTER NO.:  [id number]   MEDICAL RECORD NO.:  [id number]          PATIENT TYPE:  INP   LOCATION:  4737                         FACILITY:  [facility]   PHYSICIAN:  Dani Gobble, MD       DATE OF BIRTH:  [birth year]   DATE OF ADMISSION:  10/10/2006  DATE OF DISCHARGE:  10/14/2006                               DISCHARGE SUMMARY   DISCHARGE DIAGNOSES:  1. Coronary disease with unstable angina this admission,      catheterization revealing occluded SVG to the diagonal, plan is for      medical therapy.  2. Coronary disease, coronary artery bypass grafting July 2007 with a      LIMA to the LAD, SVG to the PDA and PL sequentially and SVG to the      diagonal and SVG to OM.  3. End-stage renal disease, followed by Dr. Fausto Skillern in [place]      with hemodialysis on Tuesday, Thursday and Saturday.  4. Presumed congestive failure with a BNP greater than 3,200 and      elevated LFTs on admission.  5. Hypertension.  6. Severe LV dysfunction at catheterization this admission with an EF      of 10-15%.   HOSPITAL COURSE:  Mr. Kossman is a 60 year old male followed by Dr.  Domingo Sep, Dr. Fausto Skillern and Dr. Sherwood Gambler.  He had bypass surgery in July.  His EF at that time was 35-40%.  He also had end-stage renal disease and  is on dialysis in [place].  He presented with increasing dyspnea and  chest pain to [facility].  EKG was abnormal with ischemic T-wave  inversion in the anterolateral leads.  Echocardiogram at [facility]  showed worsening LV function compared to pre-op.  The patient was  transferred to [facility] for further evaluation.  He was put on IV heparin.  Troponins were slightly elevated, but CK-MBs were negative.  Significance of the elevated troponins are not clear in the setting of  end-stage renal disease.  Catheterization done by Dr. Clarene Duke October 11, 2006 showed the LIMA to the LAD to be patent.  The SVG to the PDA  and PO was patent.  The SVG to the OM  was patent.  The SVG to the  diagonal was occluded at the ostium.  Dr. Clarene Duke felt the LIMA supplying  the LAD to would fill the diagonal retrograde.  Plan is for continued  medical therapy.  The patient was seen in consult by the renal service  and had dialysis here.  He did have a chest CT prior to discharge that  revealed no evidence of pulmonary emboli.  We did adjust his medications  for his blood pressure.  We feel he can be discharged on December 31  after dialysis.   LABS AT DISCHARGE:  White count 8.7, hemoglobin 9.9, hematocrit 30.5,  platelets 194, sodium 139, potassium 2.9, BUN 77, creatinine zero 6.5.  LFTs show an AST of 56, ALT of 128, that is down from admission.  BNP  was greater than 3,200 on admission.  EKG reveals a sinus  rhythm,  nonspecific ST changes.  Echo report from [facility] is not finalized  and not in the chart but reportedly shows severe LV dysfunction.   DISPOSITION:  The patient discharged in stable condition and will follow-  up Dr. Fausto Skillern, Dr. Sherwood Gambler and Dr. Domingo Sep.   DISCHARGE MEDICATIONS:  1. Norvasc 10 mg h.s.  2. Lisinopril 40 mg b.i.d.  3. Aspirin 81 mg a day.  4. Coreg 3.125 mg a day.  5. Lexapro 10 mg a day.  6. Elavil 50 mg h.s.  7. Nephro-Vite once a day.  8. Clonidine 0.1 mg b.i.d.  9. Nitroglycerin sublingual p.r.n.  10.Renagel 2 tablets t.i.d. with food.  11.He will hold his lisinopril and clonidine on the days of dialysis.      Abelino Derrick, P.A.    ______________________________  Dani Gobble, MD    LKK/MEDQ  D:  10/14/2006  T:  10/14/2006  Job:  [id number]   cc:   Dani Gobble, MD  Madelin Rear. Sherwood Gambler, MD  Louis Matte, MD

## 2011-03-02 NOTE — Op Note (Signed)
NAMESHOTA, KOHRS NO.:  [id number]   MEDICAL RECORD NO.:  [id number]          PATIENT TYPE:  INP   LOCATION:  2310                         FACILITY:  [facility]   PHYSICIAN:  Kaylyn Layer. Michelle Piper, M.D.   DATE OF BIRTH:  01-14-1951   DATE OF PROCEDURE:  DATE OF DISCHARGE:                                 OPERATIVE REPORT   TYPE OF REPORT:  Transesophageal echocardiographic probe placement and  monitoring during coronary artery bypass grafting.   DESCRIPTION OF THE PROCEDURE:  I was consulted by Dr. Evelene Croon to place  the transesophageal echo probe into the patient who presents today for  coronary artery bypass grafting.  The patient had an echocardiogram  previously which showed mild to moderate mitral regurgitation and was a  renal failure patient who presents today for coronary artery bypass  grafting.  After an uneventful induction of anesthesia and endotracheal  intubation, the transesophageal echo probe was easily passed into the  patient's stomach.  Initial short axis view of the left ventricle showed  mild left ventricular hypertrophy with no wall motion abnormality.  Ejection  fraction looked normal.  Turning to the 4-chamber view, the mitral valve  structurally looked normal.  The leaflets coapt normally in all views.  On  placing color flow Doppler across the valve, there was a trace of mitral  regurgitation.  On turning to the left atrium, it was normal.  Flow in  pulmonary veins was 40.  Intraatrial septum was normal.  Aortic valve was  trileaflet.  On placing color-flow Doppler across the valve, on longitudinal  view, there was no valvular abnormalities.  Left ventricular outflow tract  was normal.  The right side was normal.  Specifically the right atrium was  normal and the tricuspid valve showed a trace of tricuspid regurgitation  commenced with the presence of a pulmonary artery catheter.   The patient was placed on cardiopulmonary bypass by Dr.  Laneta Simmers and underwent  a coronary artery bypass grafting x5, and towards the end of the bypass,  just prior to discontinuing cardiopulmonary bypass, I evaluated the left  ventricle which was unchanged from preop.  The patient was discontinued from  cardiopulmonary bypass using minimal inotropics, and at this time the left  ventricle looked unchanged from preop, functioning normally without any wall  motion abnormalities.  Mitral valve unchanged with just a trace of mitral  regurgitation.  On placing color-flow Doppler across the valve, the left  atrium, intraatrial septum, aortic valve, and right side of the heart were  within normal limits.   At the end of the procedure, the transesophageal echo probe was removed  uneventfully, and the patient was taken to the intensive care unit in stable  condition intubated.           ______________________________  Kaylyn Layer. Michelle Piper, M.D.     KDO/MEDQ  D:  05/08/2006  T:  05/09/2006  Job:  [id number]

## 2011-03-02 NOTE — Procedures (Signed)
NAMESHAMOND, Frederick [name] NO.:  [id number]   MEDICAL RECORD NO.:  [id number]          PATIENT TYPE:  INP   LOCATION:  A212                          FACILITY:  [facility]   PHYSICIAN:  Dani Gobble, MD       DATE OF BIRTH:  1950-11-14   DATE OF PROCEDURE:  10/10/2006  DATE OF DISCHARGE:  10/10/2006                                ECHOCARDIOGRAM   REFERRING:  Dr. Sherwood Gambler and Dr. Domingo Sep.   INDICATIONS:  Congestive heart failure.   Technical quality of the study is reasonable.   The aorta measures normally at 3.3 cm.   The left atrium is at least moderately dilated, measured 5.6 cm.  The  patient appeared to be in sinus rhythm during the procedure.   The interventricular septum is mild to moderately thickened while the  posterior wall is mildly thickened.   The aortic valve is mildly thickened but quite pliable and without  limitation of leaflet excursion.  No aortic insufficiency is noted.  Doppler interrogation of the aortic valve is within normal limits.   The mitral valve appears grossly structurally normal with moderate  mitral regurgitation.  Doppler interrogation of mitral valve is within  normal limits.   Pulmonic valve appears grossly structurally normal with mild pulmonic  insufficiency.   The tricuspid valve also appears grossly structurally normal with  moderate tricuspid regurgitation, eccentrically directed towards the  inner atrial septum.   The left ventricle is mildly dilated with the LV/IDD measured at 5.8 cm.  The LV/ISD measured at 4.9 cm.  Overall left systolic function is  markedly diminished with an estimated ejection fraction of 25-35%;  however, this appears to be most consistent at the lower end of the  spectrum.  There does appear to be a regionality to this with inferior  and posterior probable severe hypokinesis to akinesis while there is  remaining at least moderate and possibly more severe global hypokinesis.  Presence of diastolic  dysfunction is inferred from pulse wave Doppler  across the mitral valve.   The right ventricle is dilated with mild to moderate decrease in right  ventricular systolic function.  The right atrium is also moderately  dilated.  There is a small posterior pericardial effusion without  evidence of hemodynamic compromise.  There does appear to be a large  left pleural effusion.   IMPRESSION:  1. At least moderate left atrial enlargement.  2. Mild to moderate concentric left ventricular hypertrophy.  3. Mild aortic sclerosis without stenosis.  4. Moderate mitral and tricuspid regurgitation, although at times      there does appear to be pulmonary vein flow reversal suggestive of      possibly severe mitral regurgitation.  5. Mild pulmonic insufficiency.  6. Mildly dilated left ventricle with severe decrease in ejection      fraction estimated at 25-35% and in most views this appears to be      consistent with the lower end of the spectrum.  Regional wall      motion abnormalities are as outlined above.  7. The presence of diastolic dysfunction is  inferred from pulse wave      Doppler across the mitral valve.  8. Moderate right ventricular enlargement with mild to moderate      decrease in right systolic function.  9. Moderate right atrial enlargement.  10.Small posterior pericardial effusion without evidence of      hemodynamic compromise.  11.Large left pleural effusion.  12.As compared to prior echocardiogram, ejection fraction appears to      be somewhat less and aortic insufficiency was not appreciated.           ______________________________  Dani Gobble, MD     AB/MEDQ  D:  10/10/2006  T:  10/11/2006  Job:  [id number]

## 2011-03-02 NOTE — Consult Note (Signed)
NAMEMANINDER, DEBOER NO.:  [id number]   MEDICAL RECORD NO.:  [id number]          PATIENT TYPE:  INP   LOCATION:  A212                          FACILITY:  [facility]   PHYSICIAN:  Jorja Loa, M.D.DATE OF BIRTH:  [birth year]   DATE OF CONSULTATION:  DATE OF DISCHARGE:                                 CONSULTATION   REASON FOR CONSULTATION:  Possible CHF and end-stage renal disease.   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  Presently came  with complaints of shortness of breath and increased abdominal girth and  also swelling of the legs.  The patient was dialyzed on Monday.  He  denies any nausea or vomiting, but the appetite is not great.  Mr.  Dubuque essentially had a similar problem after he had a cardiac cath a  couple of months ago.  He denies any cough.   PAST MEDICAL HISTORY:  History of hypertension.  History of glomerular  nephritis.  History of gout.  History of GERD.  History of overweight.  History of coronary artery disease, status post CABG, five vessel.  History of LVH.  History of cardiomyopathy, probably ischemic with  ejection fraction of 45-55%.   PAST SURGICAL HISTORY:  Status post cholecystectomy, history of  tonsillectomy, and also status post five vessel bypass.   His medications at this moment consist of aspirin 81 mg p.o. daily.  He  is also getting Nephro-Vite 1 tablet p.o. daily.  He is on Renagel 4  tablets p.o. t.i.d. with each meal.  He is also on clonidine 0.2 mg p.o.  daily, Norvasc 5 mg p.o. daily.  He is also on labetalol 400 mg p.o.  b.i.d.  He is getting also Epogen as an outpatient.   SOCIAL HISTORY:  No recent history of alcohol use or drug abuse.  He has  also a history of illicit drug use.   ALLERGIES:  PENICILLIN.   REVIEW OF SYSTEMS:  He does not have any  nausea or vomiting.  His main  complaint seems to be shortness of breath and also PND.  He also has no  cough.  He also complains of increased abdominal girth and increased  swelling.   PHYSICAL EXAMINATION:  GENERAL:  Patient seems to be alert in no  apparent distress.  VITAL SIGNS:  He is on nasal O2.  Blood pressure 155/68.  Respiratory  rate is 18.  Pulse rate is 94.  CHEST:  Decreased breath sounds, otherwise seems to be clear at this  moment.  No rales.  No rhonchi.  No egophony.  HEART:  Regular rate and rhythm.  No murmur.  ABDOMEN:  Soft.  Positive bowel sounds.  EXTREMITIES:  No edema.   His white blood cell count is 20.3, hemoglobin 11, hematocrit 34.1,  platelets 445.  His sodium is 128, potassium 5.2, chloride 97, BUN 41,  creatinine 6.2.  His CPK is 12.6, myoglobin is 215.   His chest x-ray, according to the  ER doctor, showed cardiomegaly,  otherwise no infiltrates.   ASSESSMENT:  1. Congestive heart failure:  At this moment, the patient has      significant edema and complains of shortness of breath.  His chest      seems to be clear, but he has cardiomegaly and a little puffiness      of his face.  The patient with possible __________  overload.  2. Hypertension:  At present today, his blood pressure seems to be      reasonably controlled.  3. Leukocytosis:  The etiology not clear.  At this moment, patient is      afebrile.  Cannot rule out infection.  4. History of coronary artery disease.  5. History of cardiomyopathy with low ejection fraction.  6. History of gouty arthritis.   RECOMMENDATIONS:  Will make arrangements for him to get dialysis.  Will  do a blood culture.  At this moment, also will do UA and urine culture.  I probably will start him on Levaquin.  Will continue his other  medication.  Will do also an echocardiogram in the morning to make sure  the patient does not have any pericardial effusion.  Will continue his  other medication and will  follow patient.      Jorja Loa, M.D.  Electronically Signed     BB/MEDQ  D:  10/09/2006  T:  10/09/2006  Job:  [id number]

## 2011-03-02 NOTE — Op Note (Signed)
Frederick [name], Frederick [name] NO.:  [id number]   MEDICAL RECORD NO.:  [id number]          PATIENT TYPE:  INP   LOCATION:  2310                         FACILITY:  [facility]   PHYSICIAN:  Evelene Croon, M.D.     DATE OF BIRTH:  September 09, 1951   DATE OF PROCEDURE:  05/08/2006  DATE OF DISCHARGE:                                 OPERATIVE REPORT   PREOPERATIVE DIAGNOSIS:  Severe three-vessel coronary artery disease.   POSTOPERATIVE DIAGNOSIS:  Severe three-vessel coronary artery disease.   OPERATIVE PROCEDURE:  Median sternotomy, extracorporeal circulation,  coronary artery bypass graft surgery x5 using a left internal mammary artery  graft to the left anterior descending coronary, with a saphenous vein graft  to diagonal branch of the LAD, a saphenous vein graft to the obtuse marginal  branch of the left circumflex coronary, and a sequential saphenous vein  graft to the posterior descending and posterolateral branches of the right  coronary.  Endoscopic vein harvesting from the right leg.  Insertion of left  femoral arterial line for blood pressure monitoring.   ATTENDING SURGEON:  Evelene Croon, M.D.   ASSISTANT:  Salvatore Decent. Dorris Fetch, M.D.   SECOND ASSISTANT:  Coral Ceo, P.A.-C.   ANESTHESIA:  General endotracheal.   CLINICAL HISTORY:  This patient is a 60 year old gentleman with a history of  poorly controlled hypertension resulting in end stage renal failure  requiring hemodialysis since February 2007.  He was recently noted to have  an abnormal electrocardiogram and underwent a 2-D echo showing an ejection  fraction of 35-45% with global hypokinesis.  There was moderate mitral  regurgitation and moderate tricuspid regurgitation noted.  He subsequently  had a Cardiolite scan performed that showed no evidence of ischemia but did  show an ejection fraction of 38%.  Given his left ventricular dysfunction,  it was felt that he should undergo cardiac catheterization to  better  delineate his coronary anatomy and to try to exclude coronary artery disease  as the etiology for his left ventricular dysfunction.  Cardiac  catheterization was performed by Dr. Jenne Campus on April 19, 2006.  This showed  significant three-vessel coronary disease with ejection fraction of 35-40%,  inferior and posterobasal hypokinesis.  The left main had no significant  disease.  The LAD had 40% proximal stenosis and then a 70-80% mid vessel  stenosis after a moderate size diagonal branch.  There was a focal 80%  distal stenosis.  The diagonal, itself, had about 90% stenosis in the  largest sub-branch.  The left circumflex was a small vessel that gave off  one marginal branch and had about 60% stenosis.  The right coronary was a  large dominant vessel that had about 90% proximal and diffuse 60% distal  disease.  The posterior descending had a 90% ostial stenosis and the  posterolateral branch had about 80% stenosis.  After review of the angiogram  and examination of the patient, it was felt that coronary bypass graft  surgery was the best treatment to prevent further ischemia and infarction  and improve his quality life and concur a survival advantage.  I discussed  the operative procedure with the patient and his family including  alternatives, benefits, and risks including bleeding, blood transfusion,  infection, stroke, myocardial infarction, graft failure, and death.  They  understood and agreed to proceed.   OPERATIVE PROCEDURE:  The patient was taken to the operating room and placed  on the table in a supine position.  After the induction of general  endotracheal anesthesia, a Foley catheter was placed in the bladder using  sterile technique.  Then the chest, abdomen, and both lower extremities were  prepped and draped in the usual sterile manner.  The chest was entered  through a median sternotomy incision and the pericardium was opened in the  midline.  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   Then, the left internal mammary artery was harvested from the chest wall as  a pedicle graft.  This was a medium caliber vessel with excellent blood flow  through it.  At the same time, a segment of greater saphenous vein was  harvested from the right leg using endoscopic vein harvest technique.  This  vein was of medium size and good quality.   Transesophageal echocardiogram was performed while we were doing this by  anesthesiology.  This showed good left ventricular function, estimated to be  greater than 45%.  There was trivial mitral regurgitation and trivial  tricuspid regurgitation.  There was no aortic stenosis or regurgitation.   Then, attention was turned to the pericardial adhesions.  These were divided  sharply to expose the right atrium and ascending aorta.  Then, the patient  was heparinized and when an adequate activated clotting time was achieved,  the distal ascending aorta was cannulated using a 20-French aortic cannula  for arterial in flow.  Venous outflow was achieved using a two-stage venous  cannula through the right atrial appendage.  Antegrade cardioplegia and vent  cannula was inserted in the aortic root.   The patient was placed on cardiopulmonary bypass.  The remainder of the  pericardial adhesions were sharply divided.  The distal coronaries were  identified.  The LAD was a large graftable vessel.  There was a focal area  of plaque present in the junction of the mid and distal third adjacent to a  small diagonal branch.  This corresponded to the area of stenosis noted on  the angiogram.  The first diagonal branch was a medium size graftable vessel  that gave off a graftable medial sub-branch and a small non-graftable  lateral branch.  The left circumflex had a small intermediate vessel and then a moderate size marginal branch which was intramyocardial.  It was  located  just before it ended in the muscle and was a large graftable vessel  with mild disease in it.  The posterior descending and posterolateral  branches were both medium size graftable vessels.   Then, the aorta was crossclamped and 800 mL of cold blood antegrade  cardioplegia was administered in the aortic root with quick arrest of the  heart.  Systemic hypothermia to 28 degrees centigrade and topical  hypothermia with iced saline was used.  A temperature probe was placed in  the septum and an insulating pad in the pericardium.   The first distal anastomosis was performed to the posterior descending  coronary artery.  The internal diameter was 1.75 mm.  The conduit used was a  segment of greater saphenous vein.  The anastomosis was performed in a  sequential side-to-side manner using continuous 7-0 Prolene suture.  Flow  was measured through the graft and was excellent.   The second distal anastomosis was performed to the posterolateral branch.  The internal diameter was also 1.75 mm.  The conduit used was the same  segment of greater saphenous vein.  The anastomosis was performed in a  sequential end-to-side manner using continuous 7-0 Prolene suture.  Flow was  measured through the graft and was excellent.  Then, another dose of  cardioplegia was given down the vein graft and the aortic root.   The third distal anastomosis was performed to the obtuse marginal branch.  The internal diameter was about 1.75 mm.  The conduit used was the second  segment of greater saphenous vein.  The anastomosis was performed in an end-  to-side manner using continuous 7-0 Prolene suture.  Flow was measured  through the graft and was excellent.   The fourth distal anastomosis was performed to the diagonal branch.  The  internal diameter was 1.6 mm.  The conduit used was a third segment of  greater saphenous vein.  The anastomosis was performed in an end-to-side  manner using continuous 7-0 Prolene suture.   Flow was measured through the  graft and was excellent.  Then, another dose of cardioplegia was given down  vein grafts and the aortic root.   The fifth distal anastomosis was performed to the distal LAD.  The internal  diameter of this vessel was about 2 mm.  A 1.5-mm probe passed back through  the area of distal stenosis without any difficulty.  The conduit used was  the left internal mammary graft and this was brought through an opening in  the left pericardium anterior to the phrenic nerve.  It was anastomosed to  the LAD in an end-to-side manner using continuous 8-0 Prolene suture.  The  pedicle was sutured to the epicardium with 6-0 Prolene sutures.  The patient  was rewarmed to 37 degrees centigrade.  With the crossclamp in place, the  three proximal vein graft anastomosis were performed to the aortic root in  an end-to-side manner using continuous 6-0 Prolene suture.  The clamp was removed from the mammary pedicle.  There was rapid warming of the  ventricular septum and return of spontaneous ventricular fibrillation.  The  crossclamp was removed with a time of 98 minutes and the patient  spontaneously converted to sinus rhythm.   The proximal and distal anastomoses appeared hemostatic and the lie of the  grafts satisfactory.  Graft markers were placed around the proximal  anastomoses.  Two temporary right ventricular and right atrial pacing wires  were placed and brought out through the skin.   When the patient had been rewarmed to 37 degrees centigrade, he was weaned  from cardiopulmonary bypass on low dose dopamine.  Total bypass time was 123  minutes.  Cardiac function appeared good with cardiac output of 5 liters  minute.  Transesophageal echocardiogram was performed and showed good left  ventricular function and no mitral regurgitation.  Protamine was given.  The  venous and aortic cannulae were removed without difficulty.  Hemostasis was  achieved.  Three chest tubes were  placed, two in the posterior pericardium,  one in the left pleural space, and one in the anterior mediastinum.  The  radial A-line began to malfunction and would not give [place] an accurate  pressure and, therefore, a left femoral arterial line was placed using  standard Seldinger technique without difficulty.  Then, the sternum was closed with double #6 stainless steel wires.  The  fascia was closed with continuous #1 Vicryl suture.  The subcutaneous tissue  was closed with continuous 2-0 Vicryl and the skin with 3-0 Vicryl  subcuticular closure.  The lower extremity vein harvest light was closed in  layers in a similar manner.  There was some oozing from the leg incision  and, therefore, a 19-French Blake drain was brought through a separate stab  incision and positioned within the vein harvest tunnel within the thigh.  This was connected to a bulb suction.  The leg incision was then closed with  2-0 Vicryl subcutaneous suture and 4-0 Monocryl subcuticular skin closure.  The sponge, needle and instrument counts were correct according to the scrub  nurse.  Dry sterile dressings were applied over the incisions and around the  chest tubes which were hooked to Pleur-Evac suction.  The patient remained  hemodynamically stable and was transferred to the SICU in guarded but stable  condition.      Evelene Croon, M.D.  Electronically Signed     BB/MEDQ  D:  05/08/2006  T:  05/08/2006  Job:  [id number]   cc:   Dani Gobble, MD

## 2011-03-02 NOTE — Procedures (Signed)
   NAME:  Frederick [name], Frederick [name] NO.:  [id number]   MEDICAL RECORD NO.:  [id number]                   PATIENT TYPE:  OUT   LOCATION:  RAD                                  FACILITY:  [facility]   PHYSICIAN:  Gerrit Friends. Dietrich Pates, M.D. [facility]        DATE OF BIRTH:  [birth year]   DATE OF PROCEDURE:  DATE OF DISCHARGE:                                  ECHOCARDIOGRAM   REFERRING PHYSICIAN:  1. Jonell Cluck, M.D.  2. Jesse Sans. Wall, M.D.   CLINICAL DATA:  A 60 year old gentleman with chest pain, hypertension, and  EKG abnormalities.   1. Technically adequate echocardiographic study.  2. Mild left atrial enlargement; normal right atrial size.  3. Normal right ventricular size with borderline hypertrophy and normal     systolic function.  4. Normal aortic valve, tricuspid valve, and pulmonic valve.  5. Very mild mitral valve thickening with trivial regurgitation.  6. Normal internal dimension of the left ventricle; mild to moderate     hypertrophy with disproportionate thickening of the proximal septum.     Normal regional and global LV systolic function.  7. Normal IVC.                                                   Gerrit Friends. Dietrich Pates, M.D. [facility]    RMR/MEDQ  D:  06/26/2002  T:  06/28/2002  Job:  [id number]

## 2011-03-02 NOTE — Consult Note (Signed)
Frederick [name] NO.:  [id number]   MEDICAL RECORD NO.:  [id number]          PATIENT TYPE:  INP   LOCATION:  4737                         FACILITY:  [facility]   PHYSICIAN:  Alvin C. Lowell Guitar, M.D.  DATE OF BIRTH:  09-16-1951   DATE OF CONSULTATION:  DATE OF DISCHARGE:                                 CONSULTATION   Frederick [name] is a 60 year old male with history of end-stage renal  disease secondary to hypertension.  He receives dialysis at the  [facility] in [place] on Tuesday, Thursday, and  Saturday, was admitted yesterday with chest pain.  He underwent heart  catheterization today with the recommendation to continue medical  management.  He was dialyzed yesterday.  He has had problems with volume  overload.  We were asked to evaluate and treat his anemia, hypertension,  end-stage renal disease.   HE IS ALLERGIC TO PENICILLIN.   CURRENT MEDICATIONS:  Erythropoietin, Zemplar, Benifer, amitriptyline,  clonidine, lisinopril, TriCor, labetalol, Norvasc, Renagel, Nephro-Vite,  Lexapro, aspirin and Coreg.   PAST HISTORY:  1. ESRD  2. CABG  3. Dyslipidemia,  4. Gout.  5. Anemia.  6. Secondary hyperparathyroidism.  7. Obesity.   He smokes cigarettes.  He does not drink alcohol.  He lives with his  mother.  His family history is remarkable for hypertension.   REVIEW OF SYSTEMS:  Remarkable for eating a fair amount of ice chips and  recent volume overload.   PHYSICAL EXAMINATION:  VITAL SIGNS:  Blood pressure 185/65, heart rate  58.  GENERAL:  A well-appearing male who is lethargic after his recent  procedure.  He is oriented in all spheres.  HEENT:  Atraumatic, normocephalic.  Dentures are present.  No facial  grimace.  NECK:  Supple.  LUNGS:  Clear.  HEART:  Regular rhythm rate.  ABDOMEN:  Soft.  No hepatosplenomegaly.  EXTREMITIES:  With trace to 1+ edema.  Left arm fistula is present.  There is some ecchymoses present and secondary to  recent infiltrate.   ASSESSMENT:  1. Volume overload.  2. End-stage renal disease.  3. Hypertension.  4. Coronary artery disease, status post coronary artery bypass graft.  5. Recent chest pain.  6. History of iron deficiency anemia.  7. Secondary hyperparathyroidism.  8. Depression.   PLAN:  As per orders and hemodialysis in the morning.      Mindi Slicker. Lowell Guitar, M.D.  Electronically Signed     ACP/MEDQ  D:  10/11/2006  T:  10/11/2006  Job:  [id number]   cc:   Dani Gobble, MD

## 2011-03-02 NOTE — Procedures (Signed)
NAMESHJON, LIZARRAGA NO.:  [id number]   MEDICAL RECORD NO.:  [id number]          PATIENT TYPE:  OUT   LOCATION:  RAD                           FACILITY:  [facility]   PHYSICIAN:  Dani Gobble, MD       DATE OF BIRTH:  1950-10-31   DATE OF PROCEDURE:  12/28/2005  DATE OF DISCHARGE:                                  ECHOCARDIOGRAM   REFERRING PHYSICIAN:  Patrica Duel, M.D./Ann Domingo Sep, M.D.   INDICATIONS:  A 60 year old gentleman with past medical history of  hypertension referred for evaluation of LV function.   The technical quality of the study is reasonable.   M-MODE TRACINGS:  The aorta measures normally at 3.3 cm.   Left atrium is moderate to markedly dilated measured at 5.7 cm.  The patient  appeared to be in sinus rhythm during this procedure.   The interventricular septum and posterior wall are mildly thickened.   The aortic valve appears mildly thickened, but without limitation of leaflet  excursion.  Mild aortic insufficiency is noted.  Doppler interrogation of  the aortic valve is within normal limits.   Mitral valve appears grossly structurally normal.  No mitral valve prolapse  is noted.  Moderate eccentric posteriorly directed jet of mitral  regurgitation is noted.  Doppler interrogation of mitral valve is within  normal limits.   The pulmonic valve appears grossly structurally normal.   Tricuspid valve also appears grossly structurally normal with moderate  tricuspid regurgitation noted.   The left ventricle is mildly dilated with the LVIDD measured at 5.9 cm and  the LVISD measured at 4.7 cm.  Overall left ventricle systolic function is  diminished with an estimated ejection fraction of 35-40%.  There is moderate  global hypokinesis.  I cannot exclude the possibility of an apical thrombus  on this study, although the apex is not well-visualized.   The right atrium is dilated as is the right ventricle.  Right ventricle  systolic function is  also diminished.  There does appear to be a mobile  echodensity in the right atrium and per the history on the echocardiogram  request, there appears to be a Port-A-Cath in the right atrium which would  certainly fit with this finding.   IMPRESSION:  1.  Moderate to marked left atrial enlargement.  2.  Mild to moderate concentric left ventricular hypertrophy.  3.  Mildly thickened aortic valve consistent with mild aortic sclerosis      without hemodynamically significant stenosis.  4.  Mild aortic insufficiency.  5.  Moderate mitral regurgitation noted as an eccentrically directed      posterior jet.  6.  Moderate tricuspid regurgitation eccentrically directed towards the      interatrial septum.  7.  Mildly dilated left ventricle with diminished ejection fraction      estimated at 35-40% with moderate global hypokinesis.  8.  Presence of diastolic dysfunction is inferred from pulse wave Doppler      across the mitral valve.  9.  Dilated right ventricle with diminished right ventricular systolic      function.  10. Dilated right atrium with a mobile echodensity noted and, as the      echocardiogram report form notes that there is a Port-A-Cath in the      right atrium, this would certainly fit with this finding.           ______________________________  Dani Gobble, MD     AB/MEDQ  D:  12/28/2005  T:  12/29/2005  Job:  [id number]   cc:   Patrica Duel, M.D.  Fax: [phone]

## 2011-03-02 NOTE — H&P (Signed)
NAMEDESMON, HITCHNER NO.:  [id number]   MEDICAL RECORD NO.:  [id number]          PATIENT TYPE:  INP   LOCATION:  A212                          FACILITY:  [facility]   PHYSICIAN:  Madelin Rear. Sherwood Gambler, MD  DATE OF BIRTH:  07-Oct-1951   DATE OF ADMISSION:  10/09/2006  DATE OF DISCHARGE:  LH                              HISTORY & PHYSICAL   CHIEF COMPLAINT:  Shortness of breath and swelling.   HISTORY OF PRESENT ILLNESS:  The patient presented to the emergency  department with progressively increasing shortness of breath post  dialysis 48 hours prior to presentation.  He denied any chest pain.  He  had no syncope.  He had no palpitations.  He admitted to progressively  increasing swelling as well.  There was a question of dietary  discretion, although he denied any high salt intake.  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   SOCIAL HISTORY:  The patient does smoke.  Former use of alcohol but  denies any current use.  No illicit drugs.   FAMILY HISTORY:  Noncontributory for this illness.   REVIEW OF SYSTEMS:  Under HPI.  All else negative.  Specifically, no  fever, rigors or chills or other notable precipitating factors.   PHYSICAL EXAMINATION:  He is awake, alert, and cooperative.  HEAD AND NECK:  Periorbital swelling was noted by the ER physician, gone  on the morning and examined.  His head and neck showed no JVD.  CHEST:  Clear.  CARDIAC EXAM:  Tachycardia but no gallop or rub.  ABDOMEN:  Soft.  No organomegaly or masses.  EXTREMITIES:  Trace edema bilaterally.  NEUROLOGICAL EXAM:  Nonfocal, alert and cooperative.   LABORATORY STUDIES:  Chest x-ray showed increase in cardiac silhouette  in the interval between last chest film suggestive of possible  pericardial effusion.  No frank pulmonary  vascularization or  cephalization was noted.  His blood work revealed a mild hyperkalemia at  5.2 with the expected elevation of BUN and creatinine of a renal failure  patient.  He was mildly anemic at 11 g% hemoglobin.  Again anemia  chronic disease offered.  His white count was severely elevated at 20.3.  Hyponatremia noted at 128 as well as  hyperkalemia noted above.  Calcium  was 9.2 with no simultaneous albumin.  Cardiac markers were negative.  His myoglobin was elevated at 219 (upper limit 200).   IMPRESSION:  The patient has signs and symptoms of fluid overload.  Dialysis should be curative.  The issue of his possible pericardial  effusion will be addressed post dialysis by my colleague, Dr. Domingo Sep.  Cultures _________ elevated white count empiric coverage of broad-  spectrum quinolone coverage.  Consultation with renal is anticipated as  well.      Madelin Rear. Sherwood Gambler, MD  Electronically Signed     LJF/MEDQ  D:  10/10/2006  T:  10/10/2006  Job:  [id number]

## 2011-03-02 NOTE — Op Note (Signed)
Frederick [name], Frederick [name]               ACCOUNT NO.:  [id number]   MEDICAL RECORD NO.:  [id number]          PATIENT TYPE:  AMB   LOCATION:  SDS                          FACILITY:  [facility]   PHYSICIAN:  Quita Skye. Hart Rochester, M.D.  DATE OF BIRTH:  April 28, 1951   DATE OF PROCEDURE:  12/05/2005  DATE OF DISCHARGE:                                 OPERATIVE REPORT   PREOP DIAGNOSIS:  End-stage renal disease.   POSTOP DIAGNOSIS:  End-stage renal disease.   OPERATION:  1.  Bilateral ultrasound localization internal jugular veins.  2.  Insertion Diatek catheter via right internal jugular vein (28 cm).   SURGEON:  Dr. Hart Rochester   FIRST ASSISTANT:  Nurse.   ANESTHESIA:  Local.   PROCEDURE:  The patient was taken to the operating room, placed in the  supine position at which time upper chest and neck were exposed. Both  internal jugular veins were imaged using B-mode ultrasound. Both noted to be  widely patent and normal in appearance. After infiltration of 1% Xylocaine  and prepping and draping in routine sterile manner, the right internal  jugular vein was entered using a supraclavicular approach. Guidewire passed  into the inferior vena cava under fluoroscopic guidance. After dilating the  tract appropriately a 28 cm Diatek catheter was passed through peel-away  sheath positioned in the right atrium, tunneled peripherally and secured  with nylon sutures. The wound was closed with Vicryl in a subcuticular  fashion. Sterile dressing applied. The patient taken to recovery in  satisfactory condition.           ______________________________  Quita Skye Hart Rochester, M.D.     JDL/MEDQ  D:  12/05/2005  T:  12/06/2005  Job:  [id number]

## 2011-03-02 NOTE — Discharge Summary (Signed)
Frederick [name], Frederick [name] NO.:  [id number]   MEDICAL RECORD NO.:  [id number]          PATIENT TYPE:  INP   LOCATION:  A224                          FACILITY:  [facility]   PHYSICIAN:  Patrica Duel, M.D.    DATE OF BIRTH:  Sep 08, 1951   DATE OF ADMISSION:  11/28/2005  DATE OF DISCHARGE:  02/24/2007LH                                 DISCHARGE SUMMARY   DISCHARGE DIAGNOSES:  1.  Renal failure requiring implementation of dialysis, etiology probably      hypertensive nephropathy, component of acute tubular necrosis with poor      response to intervention.  2.  Long-standing poorly controlled hypertension.  3.  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  4.  Long-standing gout and intermittent Indocin use.  5.  Status post appendectomy and tonsillectomy.  6.  History of gout.   For details regarding admission, please refer to the admitting note.  Briefly, this 60 year old male with a history as noted above presented to  the office with a two-week history of increasing severe edema of his lower  extremities as well as generalized weakness. He was found to have a blood  pressure of 200/110. He had a 3+ edema of the lower extremities. His  baseline creatinine (2003) was 2.6. He had not been seen in the office since  that time despite our urgings to do so. Given the above symptoms, the  patient was sent immediately for blood work. He was found to have a  creatinine of 6.4 and a BUN of 75. His electrolytes and acid/base status  were normal. Hemogram was marginal. Urinalysis revealed 4+ protein. He was  admitted with acute renal failure of questionable etiology.   MEDICATIONS ON ADMISSION:  Ziac 2.5 as well as Indocin p.r.n. (frequent use  despite previous warnings).   COURSE IN THE HOSPITAL:  The patient was admitted to 2A. Dr. Kristian Covey was  immediately consulted. A thorough work was undertaken. He had no  hydronephrosis. No etiology could be determined on his blood work.  He was  hydrated, administered Lasix and failed to respond. His BUN increased to  greater than 100. He was sent to [facility] for catheter placement. Dialysis was  begun without complications. He was discharged in stable condition by Dr.  Kristian Covey and received three times weekly dialysis.   MEDICATIONS:  1.  Labetalol 200 mg b.i.d.  2.  Norvasc 10 daily.  3.  Lorcet p.r.n. pain.   He will be followed and treated expectantly as an outpatient.      Patrica Duel, M.D.  Electronically Signed     MC/MEDQ  D:  01/10/2006  T:  01/12/2006  Job:  [id number]

## 2011-03-02 NOTE — Cardiovascular Report (Signed)
Frederick [name], [name] NO.:  [id number]   MEDICAL RECORD NO.:  [id number]          PATIENT TYPE:  INP   LOCATION:  4737                         FACILITY:  [facility]   PHYSICIAN:  Thereasa Solo. Little, M.D. DATE OF BIRTH:  02/18/1951   DATE OF PROCEDURE:  10/11/2006  DATE OF DISCHARGE:                            CARDIAC CATHETERIZATION   INDICATIONS FOR TEST:  This unfortunate 60 year old renal failure  patient had bypass surgery, May 08, 2006.  He has had recurrent new  onset congestive failure, has an ischemic appearing EKG, and by echo has  a decreased ejection fraction compared to his pre-op ejection fraction  which was 40-45%.  He was transferred from [place], after undergoing  dialysis x2 and is brought to the cath lab for re-evaluation of his  coronary anatomy.   After obtaining informed consent, the patient was prepped and draped in  the usual sterile fashion exposing the right groin.  Applying local  anesthetic with 1% Xylocaine, the Seldinger technique was employed and a  5-French introducer sheath was placed into the right femoral artery.  Left and right coronary arteriography, graft visualization x4, and  ventriculography in the RAO projection were performed.   COMPLICATIONS:  None.   EQUIPMENT:  Five-French Judkins configuration diagnostic catheters.   TOTAL CONTRAST:  Used 125 mL.   RESULTS:  1. Hemodynamic monitoring:  Central aortic pressure was 167/59.  Left      ventricular pressure was 165/13 with no aortic valve gradient noted      at the time of pullback.  2. Ventriculography:  Ventriculography in the RAO projection using 20      mL of contrast at 12 cc/sec revealed a dilated left ventricle.  The      inferior, apical, and distal septum were severely hypokinetic to      akinetic.  The anterior basilar segment appeared to have normal      contractility.  The left ventricular end-diastolic pressure was 24.      The ejection fraction was  approximately 15%.   CORONARY ARTERIOGRAPHY:  1. Left main normal.  It bifurcated.  2. Circumflex.  The circumflex was occluded proximally with EF  3. LAD just proximal to the diagonal had a 70% area of narrowing.  The      ongoing LAD had bidirectional flow.  There was a large septal      perforator and a first diagonal that was about 1.7-cm in diameter      that had mild proximal and mid irregularities.  4. Right coronary artery:  The right coronary was severely and      diffusely diseased all way to the PDA.   GRAFTS:  1. The saphenous vein graft to the OM:  The graft was widely patent.      The OM was bifurcated.  It was free of disease.  2. Saphenous vein graft to the diagonal.  This graft was 100% occluded      at the ostium.  3. Saphenous vein graft sequentially to the PDA and posterolateral      branch.  The graft was  widely patent.  The PDA and posterior      lateral vessels were free of significant disease.  4. LIMA to the LAD.  The internal mammary artery was large.  It      inserted into the distal portion of the LAD.  The LAD appeared to      be small and there was bidirectional flow.  There was retrograde      flow to the proximal portion of the LAD.   CONCLUSION:  1. Severe left ventricular dysfunction with an ejection fraction of      approximately 15%.  2. Loss of the saphenous vein graft to the diagonal with other grafts      being patent.   DISCUSSION:  Despite the loss of the saphenous vein graft to the  diagonal, the lesion in the LAD is proximal to the diagonal.  The LAD is  well supplied by the internal mammary artery and there is good  retrograde flow up the mid-portion of the LAD, which should be adequate  to supply the diagonal if needed.  There is currently TIMI III flow into  the diagonal from the native system despite significant stenosis  proximally.  I do not feel that this needs intervention.   We will contact the renal service, so the patient  can be dialyzed and  can hopefully be discharged to home tomorrow.   Aggressive medical therapy is mandatory plus significant fluid  restriction which the patient clearly admits to being negligent about.           ______________________________  Thereasa Solo. Little, M.D.     ABL/MEDQ  D:  10/11/2006  T:  10/11/2006  Job:  [id number]   cc:   Madelin Rear. Sherwood Gambler, MD  Dani Gobble, MD  Cath Lab

## 2011-03-05 NOTE — Consult Note (Signed)
NAMEALCIDES, NUTTING               ACCOUNT NO.:  [id number]  MEDICAL RECORD NO.:  [id number]           PATIENT TYPE:  I  LOCATION:  A306                          FACILITY:  [facility]  PHYSICIAN:  Jorja Loa, M.D.DATE OF BIRTH:  July 10, 1951  DATE OF CONSULTATION: DATE OF DISCHARGE:                                CONSULTATION   REASON FOR CONSULTATION:  End-stage renal disease for hemodialysis.  [name] is a 60 year old gentleman with history of coronary artery disease status post CABG, ischemic cardiomyopathy with recurrent congestive heart failure, noncompliant with his fluid intake.  Presently came to the emergency room because of increased swelling of the legs and also shortness of breath.  Since the patient was having some cough, he went to see his primary care physician and he was sent to the emergency room for further workup and management.  Presently, he said he is feeling better.  He does not have any shortness of breath.  No orthopnea or paroxysmal nocturnal dyspnea.  PAST MEDICAL HISTORY: 1. As above, the patient with history of end-stage renal disease.  He     is on dialysis Tuesday, Thursday, Saturday.  His last dialysis was     Saturday.  The patient is noncompliant with food. 2. History of CHF, recurrent problem, occasionally gains about 6-7 L     even at times more. 3. History of coronary artery disease status post CABG and stent     placement. 4. History of systolic and diastolic dysfunction. 5. History of hypertension.  Most of the time whenever he takes his     medication. 6. History of anemia. 7. History of gout. 8. History of carotid artery disease. 9. History of secondary hyperparathyroidism. 10.Previous history of polysubstance abuse, but recently he stated he     has stopped.  SURGICAL HISTORY:  He has history of CABG, history of endarterectomy, and history of stent placement.  MEDICATIONS AT THIS MOMENT: 1. Norvasc 5 mg p.o. daily. 2.  Aspirin 81 mg p.o. daily. 3. Zithromax 250 mg p.o. daily. 4. Coreg 25 mg p.o. daily. 5. Plavix 75 mg p.o. daily. 6. Lovenox 30 mg subcu q.24 h. 7. Hydralazine 25 mg p.o. t.i.d. 8. Imdur 60 mg p.o. daily. 9. Cozaar 100 mg p.o. daily. 10.Zocor 20 mg p.o. daily.  He is getting other medications on p.r.n. basis.  ALLERGIES:  He is allergic to PENICILLIN.  SOCIAL HISTORY:  Presently he denies any smoking.  No illicit drug use and also no history of alcohol abuse.  REVIEW OF SYSTEMS:  Mainly cough with whitish sputum production.  He does not have any chest pain.  He does not have any nausea or vomiting. His main complaint seems to be swelling of the legs, which he has on and off mainly because of noncompliance with other food.  He denies any orthopnea, paroxysmal nocturnal dyspnea.  He denies any nausea or vomiting.  PHYSICAL EXAMINATION:  VITAL SIGNS:  His temperature is 98.6, pulse of 81, blood pressure is 183/86. CHEST:  He has some inspiratory crackles. CARDIOVASCULAR:  Regular rate and rhythm.  No murmur or S3. ABDOMEN:  Soft, positive  bowel sounds. EXTREMITIES:  He has some swelling of the legs.  He has also some partial puffiness of his eyelids.  BLOOD WORK:  White blood cell count is 8.1, hemoglobin 9.9, hematocrit 32.1, platelets of 240.  Sodium 175, potassium 3.5.  BUN is 25, creatinine is 5.9, and his albumin is 3.1.  His calcium is 11.2.  His BNP is about 7000.  He has a chest x-ray, which show congestive heart failure.  RECOMMENDATIONS:  We will dialyze the patient and we will try to get about 4 L today.  We will use 2K, 2.5 calcium bath, and I will continue to advise the patient to be on low-salt diet and also to decrease his fluid intake, which seems to be an issue with him.  We will continue with other medications and we will follow the patient.  We will continue with Epogen.     Jorja Loa, M.D.     BB/MEDQ  D:  02/20/2011  T:  02/20/2011  Job:   [id number]  Electronically Signed by Jorja Loa M.D. on 03/05/2011 01:41:49 PM

## 2011-03-06 ENCOUNTER — Emergency Department (HOSPITAL_COMMUNITY): Payer: Medicare Other

## 2011-03-06 ENCOUNTER — Inpatient Hospital Stay (HOSPITAL_COMMUNITY)
Admission: EM | Admit: 2011-03-06 | Discharge: 2011-03-08 | DRG: 091 | Disposition: A | Discharge status: Home / Self Care | Payer: Medicare Other | Attending: Internal Medicine | Admitting: Internal Medicine

## 2011-03-06 ENCOUNTER — Encounter (HOSPITAL_COMMUNITY): Payer: Self-pay | Admitting: Radiology

## 2011-03-06 ENCOUNTER — Inpatient Hospital Stay (HOSPITAL_COMMUNITY): Payer: Medicare Other

## 2011-03-06 DIAGNOSIS — N186 End stage renal disease: Secondary | ICD-10-CM | POA: Diagnosis present

## 2011-03-06 DIAGNOSIS — G929 Unspecified toxic encephalopathy: Principal | ICD-10-CM | POA: Diagnosis present

## 2011-03-06 DIAGNOSIS — Z91199 Patient's noncompliance with other medical treatment and regimen due to unspecified reason: Secondary | ICD-10-CM

## 2011-03-06 DIAGNOSIS — F17200 Nicotine dependence, unspecified, uncomplicated: Secondary | ICD-10-CM | POA: Diagnosis present

## 2011-03-06 DIAGNOSIS — D631 Anemia in chronic kidney disease: Secondary | ICD-10-CM | POA: Diagnosis present

## 2011-03-06 DIAGNOSIS — Z9119 Patient's noncompliance with other medical treatment and regimen: Secondary | ICD-10-CM

## 2011-03-06 DIAGNOSIS — T40605A Adverse effect of unspecified narcotics, initial encounter: Secondary | ICD-10-CM | POA: Diagnosis present

## 2011-03-06 DIAGNOSIS — I120 Hypertensive chronic kidney disease with stage 5 chronic kidney disease or end stage renal disease: Secondary | ICD-10-CM | POA: Diagnosis present

## 2011-03-06 DIAGNOSIS — E785 Hyperlipidemia, unspecified: Secondary | ICD-10-CM | POA: Diagnosis present

## 2011-03-06 DIAGNOSIS — G92 Toxic encephalopathy: Principal | ICD-10-CM | POA: Diagnosis present

## 2011-03-06 DIAGNOSIS — I2510 Atherosclerotic heart disease of native coronary artery without angina pectoris: Secondary | ICD-10-CM | POA: Diagnosis present

## 2011-03-06 DIAGNOSIS — T424X5A Adverse effect of benzodiazepines, initial encounter: Secondary | ICD-10-CM | POA: Diagnosis present

## 2011-03-06 DIAGNOSIS — I509 Heart failure, unspecified: Secondary | ICD-10-CM | POA: Diagnosis present

## 2011-03-06 DIAGNOSIS — I5043 Acute on chronic combined systolic (congestive) and diastolic (congestive) heart failure: Secondary | ICD-10-CM | POA: Diagnosis present

## 2011-03-06 DIAGNOSIS — Z992 Dependence on renal dialysis: Secondary | ICD-10-CM

## 2011-03-06 HISTORY — DX: Essential (primary) hypertension: I10

## 2011-03-06 HISTORY — DX: Heart failure, unspecified: I50.9

## 2011-03-06 LAB — DIFFERENTIAL
Basophils Absolute: 0.1 10*3/uL (ref 0.0–0.1)
Basophils Relative: 1 % (ref 0–1)
Neutro Abs: 5.6 10*3/uL (ref 1.7–7.7)
Neutrophils Relative %: 77 % (ref 43–77)

## 2011-03-06 LAB — CBC
Hemoglobin: 10 g/dL — ABNORMAL LOW (ref 13.0–17.0)
RBC: 3.24 MIL/uL — ABNORMAL LOW (ref 4.22–5.81)
WBC: 7.3 10*3/uL (ref 4.0–10.5)

## 2011-03-06 LAB — COMPREHENSIVE METABOLIC PANEL
ALT: 10 U/L (ref 0–53)
Albumin: 3.4 g/dL — ABNORMAL LOW (ref 3.5–5.2)
BUN: 25 mg/dL — ABNORMAL HIGH (ref 6–23)
Calcium: 11.6 mg/dL — ABNORMAL HIGH (ref 8.4–10.5)
Glucose, Bld: 99 mg/dL (ref 70–99)
Sodium: 133 mEq/L — ABNORMAL LOW (ref 135–145)
Total Protein: 6.8 g/dL (ref 6.0–8.3)

## 2011-03-06 LAB — MRSA PCR SCREENING: MRSA by PCR: NEGATIVE

## 2011-03-06 LAB — AMMONIA: Ammonia: 30 umol/L (ref 11–60)

## 2011-03-06 LAB — LACTIC ACID, PLASMA: Lactic Acid, Venous: 0.9 mmol/L (ref 0.5–2.2)

## 2011-03-06 LAB — PRO B NATRIURETIC PEPTIDE: Pro B Natriuretic peptide (BNP): 70000 pg/mL — ABNORMAL HIGH (ref 0–125)

## 2011-03-06 LAB — CK TOTAL AND CKMB (NOT AT ARMC): CK, MB: 2.6 ng/mL (ref 0.3–4.0)

## 2011-03-06 LAB — TROPONIN I: Troponin I: <0.3 ng/mL (ref <0.30)

## 2011-03-06 NOTE — Discharge Summary (Signed)
NAMEKEVONTAE, BURGOON               ACCOUNT NO.:  [id number]  MEDICAL RECORD NO.:  [id number]           PATIENT TYPE:  I  LOCATION:  A306                          FACILITY:  [facility]  PHYSICIAN:  Kahlani Graber L. Lendell Caprice, MDDATE OF BIRTH:  [birth year]  DATE OF ADMISSION:  02/19/2011 DATE OF DISCHARGE:  05/09/2012LH                              DISCHARGE SUMMARY   DISCHARGE DIAGNOSES: 1. Congestive heart failure, combined diastolic and systolic, acute on     chronic and fluid overload. 2. End-stage renal disease, auric. 3. Coronary artery disease. 4. Malignant hypertension. 5. History of recent drug-eluting stent and previous coronary artery     bypass grafts. 6. Peripheral vascular disease. 7. History of left carotid endarterectomy. 8. Hyperlipidemia. 9. Tobacco abuse, counseled against. 10.Chronic anemia.  DISCHARGE MEDICATIONS: 1. Increase amlodipine to 10 mg a day. 2. Alprazolam 1 mg p.o. q.i.d. as needed for anxiety. 3. Alprazolam 2 mg nightly. 4. Aspirin 81 mg a day. 5. Tylenol 650 mg p.o. q.4 h. p.r.n. pain or fever. 6. Carvedilol 25 mg p.o. b.i.d. 7. Fenofibrate 160 mg nightly. 8. Hydralazine 50 mg p.o. t.i.d. 9. Hydrocodone/APAP 7.5/750 mg p.o. q.6 h. p.r.n. pain. 10.Imdur 60 mg a day. 11.Losartan 100 mg a day. 12.Multivitamin a day. 13.Nephro-Vite daily. 14.Niaspan 1000 mg daily. 15.Nitroglycerin 0.4 mg sublingually every 5 minutes as needed for     chest pain up to three doses. 16.Protonix 40 mg a day. 17.Plavix 75 mg a day. 18.Simvastatin 20 mg a day.  CONDITION:  Stable.  ACTIVITY:  Ad lib.  Diet should be low sodium, heart healthy with 1200 mL fluid restriction a day.  CONSULTATIONS:  Dr. Kristian Covey and Dr. Royann Shivers.  CONDITION:  Stable.  PROCEDURES:  None.  LABORATORY DATA:  On admission, pro-BNP was greater than 70,000.  CBC significant for hemoglobin of 9.9, hematocrit 32.6, MCV is 106.  His hemoglobin is improved from previous.  PT/PTT normal.   Complete metabolic panel on admission significant for a chloride of 94, glucose 138, BUN 25, creatinine 5.98.  Calcium 11.2, albumin 3.1.  Serial cardiac enzymes negative.  DIAGNOSTICS:  Doppler of the legs showed no DVT, resolving hematoma in the right femoral region, severe edema in the subcutaneous tissues of both calves.  Chest x-ray showed congestive heart failure.  EKG showed first-degree AV block, normal sinus rhythm, left axis deviation, nonspecific intraventricular conduction block.  HISTORY AND HOSPITAL COURSE:  Please see H and P for details.  Mr. Quebedeaux is a 60 year old white male with a history of ischemic cardiomyopathy and end-stage renal disease.  He is in anuric.  He presented with several days' worth of worsening edema, shortness of breath.  He had no chest pain.  He reported compliance with diet and medications.  His blood pressure was 206/103 in the emergency room and it was unclear which medications he was taking.  Apparently, his medications had changed recently.  He had JVD, rales, 3+ pitting edema. He was started back on his antihypertensives which were adjusted upward for blood pressure control.  Dr. Kristian Covey was consulted and the patient had dialysis yesterday.  He will have dialysis again today  and after he can be discharged.  His weight on admission was 93.5 kg at discharge, prior to his second dialysis is 84.8.  He was seen by Dr. Royann Shivers who felt that his pulmonary edema and malignant hypertension were secondary due to noncompliance with fluid restriction.  At the time of discharge, he has trace pitting edema in his ankles.  He has no rales.  He feels much better.  His blood pressure is running 160/80 currently, but overnight was down to about 130 systolic.  He has an appointment already set up with Dr. Alanda Amass who can make any adjustments in his medications if need be for his hypertension.  We discussed the importance of limiting dietary salt intake  and fluid restriction.  Total time on the day of discharge is greater than 30 minutes.     Abdi Husak L. Lendell Caprice, MD     CLS/MEDQ  D:  02/21/2011  T:  02/22/2011  Job:  [id number]  cc:   Gerlene Burdock A. Alanda Amass, M.D. Fax: [phone]  Madelin Rear. Sherwood Gambler, MD Fax: [phone]  Electronically Signed by Crista Curb MD on 03/06/2011 11:01:25 AM

## 2011-03-06 NOTE — H&P (Signed)
Frederick [name], Frederick [name]               ACCOUNT NO.:  [id number]  MEDICAL RECORD NO.:  [id number]           PATIENT TYPE:  I  LOCATION:  A306                          FACILITY:  [facility]  PHYSICIAN:  Frederick [name], MDDATE OF BIRTH:  25-Nov-1950  DATE OF ADMISSION:  02/19/2011 DATE OF DISCHARGE:  LH                             HISTORY & PHYSICAL   CHIEF COMPLAINT:  Leg pain and swelling.  HISTORY OF PRESENT ILLNESS:  Frederick [name] is a 60 year old white male with multiple medical problems who presents to the emergency room with leg pain and swelling.  He has a history of end-stage renal disease and is anuric.  He also has a history of heart disease with systolic and diastolic dysfunction.  Last echocardiogram in March 2012 showed an ejection fraction of 40-50% and grade 1 diastolic dysfunction.  Cardiac catheterization at that time showed ejection fraction of 35-40%.  The patient reports that his legs have gotten progressively more swollen over the past several weeks.  He often runs high blood pressure and his antihypertensives were recently changed last week.  He has had no chest pain, no nausea, no shortness of breath, no orthopnea, no paroxysmal nocturnal dyspnea.  His diet has not changed.  He reports compliance with his medications.  The doses of his medications listed in the emergency room by triage nurse are significantly lower than his last home medicine reconciliation information in the e-chart.  What he is actually taking at this time is not clear.  He apparently has also had a cough productive of clear sputum.  He saw Dr. Sherwood [name] last week and apparently is on azithromycin.  He saw him again today and sent him to the emergency room.  The patient had stenting of the LAD on January 09, 2011.  PAST MEDICAL HISTORY:  Coronary artery disease with recent stent, end- stage renal disease requiring dialysis on Tuesdays, Thursdays, Saturdays, hypertension which sounds poorly controlled,   tobacco abuse, ultrasound-guided thrombin injection in the right common femoral pseudoaneurysm on January 19, 2011, ischemic cardiomyopathy as above, mitral regurgitation, history of coronary artery bypass graft, chronic anemia with heme-positive stools during the last hospitalization.  MEDICATIONS:  According to ED notes include: 1. Losartan 100 mg a day. 2. Aspirin 81 mg a day. 3. Plavix 75 mg a day. 4. Amlodipine 2.5 mg a day. 5. Carvedilol 6.25 mg twice a day. 6. Temazepam 15 mg nightly. 7. Simvastatin 20 mg a day. 8. Azithromycin 250 mg a day.  Please note this is not verified.  ALLERGIES:  Penicillin which causes hives and shortness of breath.  SOCIAL HISTORY:  The patient smokes.  He does not drink or use drugs.  FAMILY HISTORY:  Noncontributory.  Systems reviewed and as above, otherwise negative.  PHYSICAL EXAMINATION:  VITAL SIGNS:  Temperature is 97.9, blood pressure initially 177/83, currently 206/103, pulse 61, respiratory rate 20, oxygen saturation 100% on room air. GENERAL:  The patient is slightly groggy-appearing white male, in no respiratory distress. HEENT: Normocephalic, atraumatic.  Pupils equal, round, and reactive to light.  Sclerae nonicteric.  Moist mucous membranes. NECK:  Supple.  He does  have JVD.  He has a scar over the left neck from previous carotid endarterectomy. LUNGS:  Rales at the bases.  No wheezes or rhonchi. CARDIOVASCULAR:  Regular rate and rhythm.  He has a transmitted murmur from the left arm. ABDOMEN:  Soft, nontender, nondistended. GU AND RECTAL:  Deferred. EXTREMITIES:  He has 3+ pitting edema. NEUROLOGIC:  Groggy, oriented x3.  Cranial nerves and sensory motor exam are intact. PSYCHIATRIC:  Normal affect. SKIN:  No rash.  LABS:  CBC is significant for hemoglobin 9.9, hematocrit 32.6, MCV is 106, this is actually improved from April 2012, at which time his hemoglobin was 8.7.  Anemia panel was unremarkable on January 09, 2011. Complete metabolic panel significant for a chloride of 94, glucose 138, BUN 25, creatinine 5.98, calcium 11.2.  Pro-BNP was greater than 70,000. His BNP in the past is always greater than 3200.  Troponin, CPK negative.  EKG shows normal sinus rhythm with first-degree AV block, left axis deviation, nonspecific intraventricular block, cannot rule out age indeterminate anterior infarct.  Chest x-ray shows CHF.  ASSESSMENT AND PLAN: 1. Acute on chronic combined systolic and diastolic heart failure:     The patient is anuric and needs dialysis.  I will consult Dr.     Kristian [name].  Certainly, this could be worsened by his malignant     hypertension.  We will need to clarify his home medications and I     will give increased doses for now.  He will be admitted to     telemetry.  He had a TSH drawn a few months ago which was     unremarkable.  I will check another couple sets of cardiac enzymes.     I will also consult [facility] as he is known     to them.  He has an echo last month.  I will check Dopplers of his     legs to rule out deep vein thrombosis. 2. Malignant hypertension, see above. 3. Coronary artery disease. 4. End-stage renal disease. 5. Chronic anemia, stable. 6. Tobacco abuse, counseled against. 7. End-stage renal disease.     Frederick Thueson L. Lendell Caprice, [name]     CLS/MEDQ  D:  02/19/2011  T:  02/20/2011  Job:  [id number]  cc:   Frederick Rear. Frederick Gambler, [name] Fax: [phone]  Frederick [name], M.D. Fax: [phone]  Frederick [name], M.D. Fax: [phone]  Electronically Signed by Frederick [name] on 03/06/2011 11:01:26 AM

## 2011-03-07 ENCOUNTER — Inpatient Hospital Stay (HOSPITAL_COMMUNITY): Payer: Medicare Other

## 2011-03-07 LAB — COMPREHENSIVE METABOLIC PANEL
ALT: 8 U/L (ref 0–53)
Calcium: 10.5 mg/dL (ref 8.4–10.5)
GFR calc Af Amer: 14 mL/min — ABNORMAL LOW (ref >60)
Glucose, Bld: 81 mg/dL (ref 70–99)
Sodium: 136 mEq/L (ref 135–145)
Total Protein: 6.4 g/dL (ref 6.0–8.3)

## 2011-03-07 LAB — DIFFERENTIAL
Eosinophils Absolute: 0.2 10*3/uL (ref 0.0–0.7)
Lymphs Abs: 1 10*3/uL (ref 0.7–4.0)
Monocytes Relative: 9 % (ref 3–12)
Neutro Abs: 5.5 10*3/uL (ref 1.7–7.7)
Neutrophils Relative %: 74 % (ref 43–77)

## 2011-03-07 LAB — CBC
Hemoglobin: 9.8 g/dL — ABNORMAL LOW (ref 13.0–17.0)
MCH: 31.2 pg (ref 26.0–34.0)
MCV: 102.5 fL — ABNORMAL HIGH (ref 78.0–100.0)
RBC: 3.14 MIL/uL — ABNORMAL LOW (ref 4.22–5.81)

## 2011-03-12 NOTE — Discharge Summary (Signed)
NAMEHENSON, FRATICELLI               ACCOUNT NO.:  [id number]  MEDICAL RECORD NO.:  [id number]           PATIENT TYPE:  I  LOCATION:  A301                          FACILITY:  [facility]  PHYSICIAN:  Jeoffrey Massed, MD    DATE OF BIRTH:  Feb 28, 1951  DATE OF ADMISSION:  03/06/2011 DATE OF DISCHARGE:  LH                         DISCHARGE SUMMARY-REFERRING   PRIMARY CARE PRACTITIONER:  Madelin Rear. Sherwood Gambler, MD  PRIMARY CARDIOLOGIST:  [facility].  PRIMARY NEPHROLOGIST:  Jorja Loa, MD  PRIMARY DISCHARGE DIAGNOSES: 1. Congestive heart failure and fluid overload secondary to     noncompliance with diet. 2. Questionable altered mental status secondary to medications, now     resolved.  SECONDARY DISCHARGE DIAGNOSES: 1. End-stage renal disease on hemodialysis. 2. History of coronary artery disease status post PTCA. 3. History of uncontrolled hypertension. 4. History of peripheral vascular disease. 5. History of carotid artery disease status post left carotid     endarterectomy. 6. Dyslipidemia. 7. Chronic anemia secondary to end-stage renal disease. 8. Tobacco abuse. 9. History of anxiety.  DISCHARGE MEDICATIONS: 1. Epogen 80,000 units intravenously Tuesdays, Thursdays, and     Saturdays during dialysis. 2. Simvastatin 20 mg 1 tablet daily at bedtime. 3. Amlodipine 2.5 mg 1 tablet daily. 4. Xanax 1 mg 1 tablet p.o. 4 times a day p.r.n. 5. Aspirin 81 mg 1 tablet daily. 6. Tylenol 325 mg 2 tablets p.o. q.6 p.r.n. 7. Coreg 6.25 mg 1 tablet p.o. twice daily. 8. Losartan 100 mg 1 tablet p.o. at bedtime. 9. Multivitamins over-the-counter 1 tablet daily. 10.Renal vitamin 1 tablet p.o. daily at bedtime. 11.Nitroglycerin 0.4 mg 1 tablet under the tongue every 5 minutes up     to 3 doses p.r.n. 12.Plavix 75 mg 1 tablet daily. 13.Temazepam 15 mg 1 capsule p.o. at bedtime.  CONSULTATIONS:  Jorja Loa, MD from Nephrology.  BRIEF HISTORY OF PRESENT ILLNESS:   The patient is a 60 year old male with a history of end-stage renal disease on hemodialysis who has been noncompliant to his dietary regimen was brought into the hospital on May 22 with complaints of fluid overload and swelling of his legs along with shortness of breath.  He was also having some mild drowsiness and was falling.  He was then admitted to the Hospitalist Service for further evaluation and treatment.  For further details, please see the history and physical that is dictated by Dr. Karilyn Cota on admission.  PERTINENT LABORATORY DATA: 1. TSH on admission was 1.022. 2. Last CBC shows a hemoglobin of 9.8. 3. Ammonia level on admission was 30. 4. BNP on admission was 70,000. 5. Troponin on admission was negative.  RADIOLOGICAL STUDIES: 1. X-ray of the chest done on Mar 06, 2011 showed new bibasilar     airspace opacities concerning for multifocal pneumonia.  The     appearance is less for edema though mild vascular congestion is     noted.  Mild cardiomegaly. 2. CT of the head without contrast showed no acute intracranial  pathology seen in CT.  Mild cortical volume loss and scattered     small vessel ischemic microangiopathy.  Large region  of     encephalomalacia within the inferior right cerebellar hemisphere,     cerebellar atrophy.  Chronic lacunar infarcts in the basal ganglia,     more prominent on the right.  BRIEF HOSPITAL COURSE: 1. CHF and fluid overload.  This was secondary to noncompliance to his     dietary regimen.  The patient has been extensively counseled by me     as well as with his nephrologist.  The patient did undergo     hemodialysis and did well.  He was seen by his nephrologist as well     and has been cleared for the patient to be discharged home.  The     patient did have hemodialysis with 4 liters of fluid removed on the     23rd as well as 4 liters removed on the 26th with total of 8 liters     removed in the past 2 days.  He will follow up with  his primary     nephrologist and at his primary hemodialysis center at his regular     schedule for ongoing dialysis. 2. Questionable altered mental status and drowsiness.  This was     perhaps secondary to the medication that he takes namely the     benzodiazepine and the narcotics.  Since he takes benzodiazepines     almost on a regular basis, I am at this point inclined not to stop     it abruptly but the patient has been advised to minimize the use as     much as possible.  However, I am going to discontinue his Norco for     now. 3. Coronary artery disease status post PTCA.  The patient is     maintained on aspirin and Plavix.  He will follow up with his     primary cardiologist at his next scheduled appointment. 4. History of uncontrolled hypertension.  All of his antihypertensive     regimen will be started on discharge as well.  DISPOSITION:  It is felt that this patient is stable for the patient to be discharged home.  FOLLOWUP INSTRUCTIONS: 1. The patient is to follow up with his primary care practitioner     within 1 week upon discharge.  He is to call and make an     appointment. 2. The patient is to follow up with his primary nephrologist and at     his primary hemodialysis center at his next scheduled hemodialysis     appointment.  Total time spent coordinating discharge more than 45 minutes.     Jeoffrey Massed, MD     SG/MEDQ  D:  03/08/2011  T:  03/08/2011  Job:  [id number]  cc:   Jorja Loa, M.D. Fax: [phone]  Madelin Rear. Sherwood Gambler, MD Fax: [phone]  Richard A. Alanda Amass, M.D. Fax: [phone]  Electronically Signed by Jeoffrey Massed  on 03/12/2011 05:40:52 PM

## 2011-03-14 NOTE — H&P (Signed)
Frederick [name], Frederick [name]               ACCOUNT NO.:  [id number]  MEDICAL RECORD NO.:  [id number]           PATIENT TYPE:  E  LOCATION:  APED                          FACILITY:  [facility]  PHYSICIAN:  Wilson Singer, M.D.DATE OF BIRTH:  1951/07/18  DATE OF ADMISSION:  03/06/2011 DATE OF DISCHARGE:  LH                             HISTORY & PHYSICAL   CHIEF COMPLAINT:  Altered mental status and drowsiness.  HISTORY OF PRESENT ILLNESS:  This 60 year old man was seen in the emergency room because for the last 2-3 days, he has been somewhat drowsy and lethargic.  The family were worried about him.  His has also gained significant fluid according to the family at least 30 pounds. The patient has been largely noncompliant with fluid intake and has a history of doing so.  Also, the sister who was at the bedside told me that he is prescribed alprazolam 1 mg four times a day but in fact he has most likely been taking more than his prescribed dose, as she showed me the bottle which was significantly lower in quantity then there should have been.  The patient has end-stage renal disease and is anuric and receives hemodialysis on Tuesdays, Thursdays, and Saturdays.  He denies any chest pain, shortness of breath, but he describe increasing swelling of his legs.  Please see the most recent admission dated Feb 19, 2011, to the Feb 21, 2011, when he was admitted again with leg pain and swelling.  PAST MEDICAL HISTORY:  Significant for systolic and diastolic congestive heart failure, coronary artery disease, status post CABG, ischemic cardiomyopathy, mitral regurgitation, chronic anemia, tobacco abuse, and hypertension.  He has had carotid endarterectomy on the left side.  SOCIAL HISTORY:  He has been divorced 3 times and lives alone.  He continues to still smoke cigarettes.  He does not drink alcohol.  FAMILY HISTORY:  Noncontributory.  ALLERGIES:  PENICILLIN.  MEDICATIONS: 1. Alprazolam 1 mg  q.i.d. 2. Carvedilol 6.25 mg daily. 3. Aspirin 81 mg daily. 4. Hydrocodone/acetaminophen 7.5/500, dose unclear. 5. Losartan 100 mg daily. 6. Simvastatin 200 milligrams nightly. 7. Plavix 75 mg daily. 8. Amlodipine 2.5 mg daily.  REVIEW OF SYSTEMS:  Apart from the symptoms mentioned above there are no other symptoms referable to all systems reviewed.  PHYSICAL EXAMINATION:  VITAL SIGNS:  Temperature 97.4, blood pressure 168/89, pulse 62, saturation of 94% on room air, respiratory rate 12 to 14. GENERAL:  He does actually looks systemically well and appears to be drowsy, but he is able to respond appropriately when I talk to him. CARDIOVASCULAR:  Heart sounds are present and normal.  Jugular venous pressure is raised 3-4 cm above the angle of Louis.  Heart sounds are present and normal without murmurs. CHEST:  Lung fields are clear. ABDOMEN:  Soft and nontender but distended.  There is no hepatosplenomegaly. NEUROLOGICAL:  He is drowsy but able to respond appropriately.  There are no focal neurological signs.  I think this is drug effect.  He does have peripheral pitting edema in his legs 4+. SKIN:  There are no obvious skin lesions or rashes.  INVESTIGATIONS:  Hemoglobin 10.0, white blood cell count 7.3, platelets 177, MCV 102.2.  Pro-BNP is 70,000.  Sodium 133, potassium 4.1, bicarbonate 28, BUN 25, creatinine 6.41, albumin 3.4, calcium 11.6.  CT scan of the head is unremarkable for any acute pathology.  There appears to be a large region of encephalomalacia within the inferior right cerebellar and there are cerebellar atrophy here.  There are chronic lacunar infarcts in the basal ganglia and more prominent on the right. He denies any history of cerebrovascular disease but it seems to be clear that he has had some sort of cerebrovascular disease here.  PROBLEM LIST: 1. Drowsiness secondary to excessive abuse of benzodiazepines and/or     opioids. 2. End-stage renal disease on  hemodialysis with noncompliance of fluid     intake. 3. Systolic and diastolic heart failure. 4. Hypertension. 5. CT scan finding of cerebrovascular disease, especially cerebellar     disease.  PLAN: 1. Admit to telemetry. 2. Increase in antihypertensive medication but this will be improved     by hemodialysis. 3. Hemodialysis per Nephrology. 4. Reduced benzodiazepine intake if possible.     Wilson Singer, M.D.     NCG/MEDQ  D:  03/06/2011  T:  03/06/2011  Job:  [id number]  cc:   Madelin Rear. Sherwood Gambler, MD Fax: [phone]  Dr. Kristian Covey  Electronically Signed by Lilly Cove M.D. on 03/14/2011 12:28:53 PM

## 2011-03-15 NOTE — Consult Note (Signed)
NAMELEONE, PUTMAN               ACCOUNT NO.:  [id number]  MEDICAL RECORD NO.:  [id number]           PATIENT TYPE:  I  LOCATION:  A301                          FACILITY:  [facility]  PHYSICIAN:  Jorja Loa, M.D.DATE OF BIRTH:  03/01/1951  DATE OF CONSULTATION: DATE OF DISCHARGE:                                CONSULTATION   REASON FOR CONSULTATION:  CHF and end-stage renal disease.  Frederick [name] is a 60 year old patient with history of end-stage renal disease, on maintenance hemodialysis Tuesday, Thursday, Saturday and history of noncompliant with fluid, history of ischemic cardiomyopathy. The patient presently came because of shortness of breath and orthopnea and a history of fall.  According to his mother, the patient was having difficulty in breathing since last night.  And when he was trying to get up, he felt dizzy.  Mr. Tumminello presently states that he has been feeling thirsty and drinking fluid but he thought he did not drink more than what he usually drinks.  But however the patient has been coming to the dialysis unit with lots of fluid and usually very difficult to remove most of the fluid.  Occasionally it comes 7-8 L over his dry weight.  Presently denies any fevers, chills or sweating.  PAST MEDICAL HISTORY: 1. History of recurrent CHF from noncompliance with fluid and salt     intake. 2. History of coronary artery disease status post CABG and stent     placement.  These are symptomatic. 3. Systolic and diastolic dysfunction. 4. History of hypertension. 5. History of anemia. 6. History of carotid artery disease. 7. History of gout. 8. History of secondary hyperparathyroidism. 9. Previous history of polysubstance abuse.  Presently, the patient     denies.  SURGICAL HISTORY:  He has history of CABG, history of endarterectomy and history of also stent placement.  MEDICATIONS:  As an outpatient consist of; 1. Hydralazine 25 mg p.o. t.i.d. 2. Imdur 60 mg p.o.  daily. 3. Cozaar 100 mg p.o. daily. 4. He is also on Norvasc 5 mg p.o. daily. 5. Coreg 25 mg p.o. daily. 6. Plavix 75 mg p.o. daily.  ALLERGIES:  He is allergic to PENICILLIN.  SOCIAL HISTORY:  At this moment he denies any history of illicit drug use.  No history of alcohol abuse.  No history of smoking.  REVIEW OF SYSTEMS:  Mainly shortness of breath, some orthopnea and occasional cough.  He has also paroxysmal nocturnal dyspnea.  He has a history of fall and some pain on the right side.  The patient denies any nausea or vomiting.  Appetite is good.  PHYSICAL EXAMINATION:  VITAL SIGNS:  His temperature is 97.9, blood pressure 177/83, pulse of 64, respiratory rate is 20. HEENT:  He has partial facial puffiness, swelling of his eyelid and moist oral mucosa. NECK:  He has elevated JVD. CHEST:  He has some respiratory crackles bilaterally. HEART:  Regular rate and rhythm.  No murmur.  No S3. ABDOMEN:  Soft, positive bowel sounds. EXTREMITIES:  He has 2 to 3+ edema.  LABORATORY FINDINGS:  His white blood cell count is 7.3, hemoglobin 10, hematocrit 33.1, platelet  of 177.  Sodium is 133, potassium is 4, and BUN is 25, creatinine is 6.41.  His albumin is 3.4 and calcium of 11.6. BNP of 70,000.  His chest x-ray show mild cardiomegaly.  He has bilateral airspace disease.  There is a new also opacity.  ASSESSMENT: 1. Congestive heart failure, very noncompliant with his diet,     presently with significant sign of fluid overload from uncontrolled     fluid and salt intake. 2. End-stage renal disease.  He is status post hemodialysis on     Saturday.  BUN and creatinine within acceptable range.  Normal     potassium.  He is due for dialysis today. 3. History of hypertension.  Blood pressure seems to be somewhat high     just because he did not take his medications, also possibly this     may be related to his fluid intake. 4. History of coronary artery disease status post coronary  artery     bypass graft and stent placement, symptomatic. 5. History of anemia.  He is on Epogen.  H and H stable. 6. History of gout. 7. History of carotid artery disease. 8. History of secondary hyperparathyroidism. 9. History of hypercalcemia.  His calcium at this moment seems to be     somewhat high. 10.History of hypercholesterolemia.  RECOMMENDATIONS:  We will make arrangement for the patient to get dialysis today.  We will try to get another, probably about 4-1/2 L today and then we will try to do the same thing if he stays.  Last time he left the hospital against medical advice.  We will continue his other medications.  The patient is advised to cut down his fluid intake.     Jorja Loa, M.D.     BB/MEDQ  D:  03/06/2011  T:  03/06/2011  Job:  [id number]  Electronically Signed by Jorja Loa M.D. on 03/15/2011 03:44:09 PM

## 2011-07-13 LAB — URINALYSIS, ROUTINE W REFLEX MICROSCOPIC
Ketones, ur: NEGATIVE
Protein, ur: 100 — AB
Urobilinogen, UA: 0.2

## 2011-07-13 LAB — BASIC METABOLIC PANEL
BUN: 27 — ABNORMAL HIGH
CO2: 24
Calcium: 7.4 — ABNORMAL LOW
Creatinine, Ser: 10.14 — ABNORMAL HIGH
GFR calc Af Amer: 6 — ABNORMAL LOW

## 2011-07-13 LAB — POCT I-STAT 4, (NA,K, GLUC, HGB,HCT)
Glucose, Bld: 91
Potassium: 3.4 — ABNORMAL LOW
Sodium: 135

## 2011-07-13 LAB — CBC
HCT: 37.7 — ABNORMAL LOW
Hemoglobin: 12.2 — ABNORMAL LOW
MCHC: 32.3
MCHC: 33.2
MCV: 101.6 — ABNORMAL HIGH
Platelets: 207
RBC: 3.08 — ABNORMAL LOW
RDW: 15.4
RDW: 15.1

## 2011-07-13 LAB — PROTIME-INR
INR: 1.1
Prothrombin Time: 14.5

## 2011-07-13 LAB — URINE MICROSCOPIC-ADD ON

## 2011-07-13 LAB — COMPREHENSIVE METABOLIC PANEL
BUN: 22
Calcium: 9.2
Glucose, Bld: 90
Total Protein: 6.9

## 2011-07-13 LAB — TYPE AND SCREEN: Antibody Screen: NEGATIVE

## 2011-09-23 IMAGING — CR DG CHEST 1V PORT
1 series · 1 of 1 positions shown · non-contrast
Comparison: 01/07/2011

CLINICAL DATA: Myocardial infarction.  Short of breath.

PORTABLE CHEST - 1 VIEW

[AP]
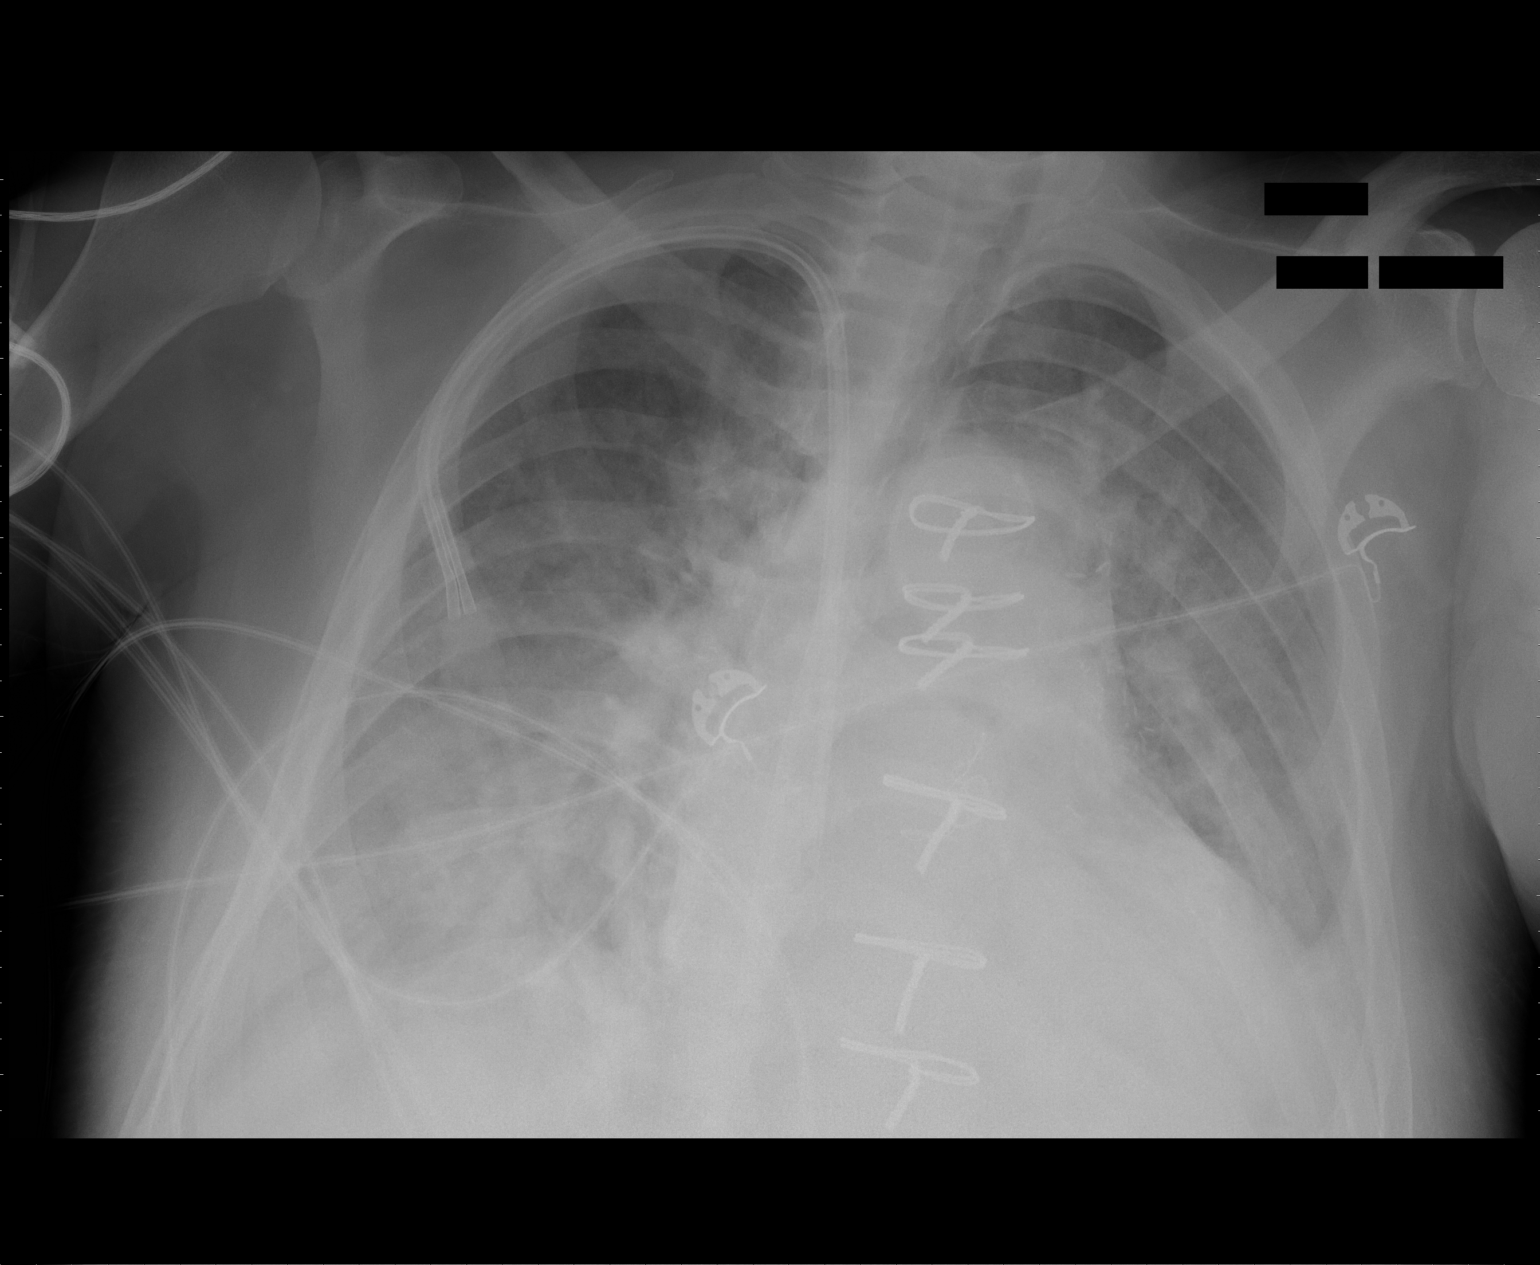

[1 of 1 positions shown; findings below may reference images not displayed]

FINDINGS: Progression of diffuse bilateral airspace disease
compatible with pulmonary edema.  Small pleural effusions and
bibasilar atelectasis are  more prominent than the prior study.

Central venous catheter tip at the cavoatrial junction, unchanged.
IMPRESSION: Progression of pulmonary edema.

Progression of bilateral effusions and bibasilar atelectasis.

## 2011-10-16 DEATH — deceased

## 2011-10-27 ENCOUNTER — Encounter (HOSPITAL_COMMUNITY): Payer: Self-pay | Admitting: *Deleted

## 2011-10-27 ENCOUNTER — Other Ambulatory Visit: Payer: Self-pay

## 2011-10-27 ENCOUNTER — Emergency Department (HOSPITAL_COMMUNITY)
Admission: EM | Admit: 2011-10-27 | Discharge: 2011-10-27 | Disposition: A | Discharge status: Home / Self Care | Payer: Medicare Other | Attending: Emergency Medicine | Admitting: Emergency Medicine

## 2011-10-27 DIAGNOSIS — R5381 Other malaise: Secondary | ICD-10-CM | POA: Insufficient documentation

## 2011-10-27 DIAGNOSIS — R55 Syncope and collapse: Secondary | ICD-10-CM | POA: Insufficient documentation

## 2011-10-27 DIAGNOSIS — I459 Conduction disorder, unspecified: Secondary | ICD-10-CM | POA: Insufficient documentation

## 2011-10-27 DIAGNOSIS — I509 Heart failure, unspecified: Secondary | ICD-10-CM | POA: Insufficient documentation

## 2011-10-27 DIAGNOSIS — Z992 Dependence on renal dialysis: Secondary | ICD-10-CM | POA: Insufficient documentation

## 2011-10-27 DIAGNOSIS — I959 Hypotension, unspecified: Secondary | ICD-10-CM | POA: Insufficient documentation

## 2011-10-27 DIAGNOSIS — I10 Essential (primary) hypertension: Secondary | ICD-10-CM | POA: Insufficient documentation

## 2011-10-27 DIAGNOSIS — E876 Hypokalemia: Secondary | ICD-10-CM | POA: Insufficient documentation

## 2011-10-27 DIAGNOSIS — F17200 Nicotine dependence, unspecified, uncomplicated: Secondary | ICD-10-CM | POA: Insufficient documentation

## 2011-10-27 DIAGNOSIS — R42 Dizziness and giddiness: Secondary | ICD-10-CM | POA: Insufficient documentation

## 2011-10-27 DIAGNOSIS — N186 End stage renal disease: Secondary | ICD-10-CM | POA: Insufficient documentation

## 2011-10-27 LAB — DIFFERENTIAL
Basophils Absolute: 0.1 10*3/uL (ref 0.0–0.1)
Eosinophils Relative: 3 % (ref 0–5)
Lymphocytes Relative: 9 % — ABNORMAL LOW (ref 12–46)
Monocytes Relative: 9 % (ref 3–12)
Neutrophils Relative %: 78 % — ABNORMAL HIGH (ref 43–77)

## 2011-10-27 LAB — CBC
Platelets: 167 10*3/uL (ref 150–400)
RBC: 3.95 MIL/uL — ABNORMAL LOW (ref 4.22–5.81)
RDW: 16 % — ABNORMAL HIGH (ref 11.5–15.5)
WBC: 11.8 10*3/uL — ABNORMAL HIGH (ref 4.0–10.5)

## 2011-10-27 LAB — BASIC METABOLIC PANEL
Chloride: 89 mEq/L — ABNORMAL LOW (ref 96–112)
GFR calc Af Amer: 17 mL/min — ABNORMAL LOW (ref >90)
GFR calc non Af Amer: 15 mL/min — ABNORMAL LOW (ref >90)
Potassium: 3.1 mEq/L — ABNORMAL LOW (ref 3.5–5.1)

## 2011-10-27 MED ORDER — SODIUM CHLORIDE 0.9 % IV SOLN
Freq: Once | INTRAVENOUS | Status: AC
  Administered 2011-10-27: 12:00:00 via INTRAVENOUS

## 2011-10-27 MED ORDER — POTASSIUM CHLORIDE 20 MEQ PO PACK
20.0000 meq | PACK | Freq: Once | ORAL | Status: AC
  Administered 2011-10-27: 20 meq via ORAL
  Filled 2011-10-27: qty 1

## 2011-10-27 MED ORDER — POTASSIUM CHLORIDE ER 10 MEQ PO TBCR: 20.0000 meq | EXTENDED_RELEASE_TABLET | Freq: Two times a day (BID) | ORAL | Status: DC

## 2011-10-27 NOTE — ED Notes (Signed)
Pt received via EMS secondary to c/o dizziness and hypotension during dialysis. Pt had LOC after standing. Pt reports 2 nurses "gently lowered pt to floor".

## 2011-10-27 NOTE — ED Provider Notes (Signed)
History   Scribed for Donnetta Hutching, MD, the patient was seen in [facility]. The chart was scribed by Gilman Schmidt. The patients care was started at 11:54 AM.   CSN: [id number]  Arrival date & time 10/27/11  1111   First MD Initiated Contact with Patient 10/27/11 1131      Chief Complaint  Patient presents with  . Hypotension    (Consider location/radiation/quality/duration/timing/severity/associated sxs/prior treatment) HPI Frederick [name] is a 61 y.o. male with a history of HTN, Renal insufficiency, and CHF, who presents to the Emergency Department complaining of hypotension. Pt brought in by EMS secondary to complaint of weakness, dizziness, syncope, and hypotension during dialysis. Pt receives dialysis on Tues. Thurs. And Sat. (for the past 6 years). States that he possibly received too much fluid. Mother notes that patient has lost 100 pounds since hes been sick. 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 Patient reports "feeling great now". Pt has had prior similar symptoms. Also notes that pt is not eating well because he is unable to keep down food. There are no other associated symptoms and no other alleviating or aggravating factors.     Past Medical History  Diagnosis Date  . Hypertension   . Renal insufficiency   . CHF (congestive heart failure)     Past Surgical History  Procedure Date  . Av shunt placement   . Appendectomy   . Cardiac surgery   . Vasculor surgery     No family history on file.  History  Substance Use Topics  . Smoking status: Current Everyday Smoker -- 0.5 packs/day    Types: Cigarettes  . Smokeless tobacco: Not on file  . Alcohol Use: No      Review of Systems  Neurological: Positive for dizziness, syncope and weakness.  All other systems reviewed and are negative.    Allergies  Penicillins  Home Medications  No current outpatient prescriptions on file.  BP 76/56  Pulse 83  Temp(Src) 97.7 F (36.5 C) (Oral)  Resp 21   SpO2 94%  Physical Exam  Constitutional: He is oriented to person, place, and time. He appears well-developed and well-nourished.  Non-toxic appearance. He does not have a sickly appearance.  HENT:  Head: Normocephalic and atraumatic.  Eyes: Conjunctivae, EOM and lids are normal. Pupils are equal, round, and reactive to light.  Neck: Trachea normal, normal range of motion and full passive range of motion without pain. Neck supple.  Cardiovascular: Regular rhythm and normal heart sounds.   Pulmonary/Chest: Effort normal and breath sounds normal. No respiratory distress.  Abdominal: Soft. Normal appearance. He exhibits no distension. There is no tenderness. There is no rebound and no CVA tenderness.  Musculoskeletal: Normal range of motion.  Neurological: He is alert and oriented to person, place, and time. He has normal strength.  Skin: Skin is warm, dry and intact. No rash noted.  Psychiatric: He has a normal mood and affect.    ED Course  Procedures (including critical care time)  Labs Reviewed - No data to display No results found.   No diagnosis found.   DIAGNOSTIC STUDIES: Oxygen Saturation is 94% on room air, adequate by my interpretation.     Date: 10/27/2011  Rate: 61  Rhythm: normal sinus rhythm  QRS Axis: left  Intervals: normal  ST/T Wave abnormalities: normal  Conduction Disutrbances:nonspecific intraventricular conduction delay  Narrative Interpretation:   Old EKG Reviewed: changes noted  LABS Results for orders placed during the hospital encounter of 10/27/11  CBC      Component Value Range   WBC 11.8 (*) 4.0 - 10.5 (K/uL)   RBC 3.95 (*) 4.22 - 5.81 (MIL/uL)   Hemoglobin 13.1  13.0 - 17.0 (g/dL)   HCT 16.1  09.6 - 04.5 (%)   MCV 99.5  78.0 - 100.0 (fL)   MCH 33.2  26.0 - 34.0 (pg)   MCHC 33.3  30.0 - 36.0 (g/dL)   RDW 40.9 (*) 81.1 - 15.5 (%)   Platelets 167  150 - 400 (K/uL)  DIFFERENTIAL      Component Value Range   Neutrophils Relative 78 (*) 43  - 77 (%)   Lymphocytes Relative 9 (*) 12 - 46 (%)   Monocytes Relative 9  3 - 12 (%)   Eosinophils Relative 3  0 - 5 (%)   Basophils Relative 1  0 - 1 (%)   Neutro Abs 9.1 (*) 1.7 - 7.7 (K/uL)   Lymphs Abs 1.1  0.7 - 4.0 (K/uL)   Monocytes Absolute 1.1 (*) 0.1 - 1.0 (K/uL)   Eosinophils Absolute 0.4  0.0 - 0.7 (K/uL)   Basophils Absolute 0.1  0.0 - 0.1 (K/uL)   RBC Morphology SPECIMEN CHECKED FOR CLOTS     WBC Morphology WHITE COUNT CONFIRMED ON SMEAR    BASIC METABOLIC PANEL      Component Value Range   Sodium 131 (*) 135 - 145 (mEq/L)   Potassium 3.1 (*) 3.5 - 5.1 (mEq/L)   Chloride 89 (*) 96 - 112 (mEq/L)   CO2 31  19 - 32 (mEq/L)   Glucose, Bld 87  70 - 99 (mg/dL)   BUN 10  6 - 23 (mg/dL)   Creatinine, Ser 9.14 (*) 0.50 - 1.35 (mg/dL)   Calcium 78.2 (*) 8.4 - 10.5 (mg/dL)   GFR calc non Af Amer 15 (*) >90 (mL/min)   GFR calc Af Amer 17 (*) >90 (mL/min)     COORDINATION OF CARE: 11:54am:  - Patient evaluated by ED physician, EKG ordered    MDM  Patient has end-stage renal disease. Had dialysis today.  He states too much fluid was taken off.  Patient is alert and pleasant, but hypotensive when standing Recheck at 1400;  Feels much better. low potassium replaced. We'll discharge  I personally performed the services described in this documentation, which was scribed in my presence. The recorded information has been reviewed and considered.        Donnetta Hutching, MD 10/27/11 1419

## 2011-10-27 NOTE — ED Notes (Signed)
EMS reports pt was at dialysis today and they pulled off 3.5liters and was only supposed to take off 1-1 1/2liters.  Says they gave pt .  Pts bp 40sytolic with sitting up.  EMS administered approx NSS and bp increased to 96systolic.  PT says was dizzy initially but at present denies dizziness.  PT is alert and oriented, very pleasant.

## 2011-10-27 NOTE — ED Notes (Signed)
Pt alert and orientated x 4. Pt states feels much better now. B/P back to WNL.

## 2011-11-04 IMAGING — US US EXTREM LOW VENOUS BILAT
1 series · 13 of 24 positions shown · non-contrast
Comparison: No prior venous Doppler ultrasound.  Right groin
ultrasound 01/15/2011.

CLINICAL DATA: Pinning edema involving both lower extremities.
Recent thrombin injection into a right femoral artery
pseudoaneurysm related to cardiac catheterization.

BILATERAL LOWER EXTREMITY VENOUS DOPPLER ULTRASOUND 02/19/2011:
TECHNIQUE: Gray-scale sonography with compression, as well as
color and duplex Doppler ultrasound, were performed to evaluate the
deep venous system from the level of the common femoral vein
through the popliteal and proximal calf veins. Evaluation also
included physiologic response to augmentation.

[Series 1: us extrem low venous bilat · 13 of 85 slices shown]
[im 1/85]
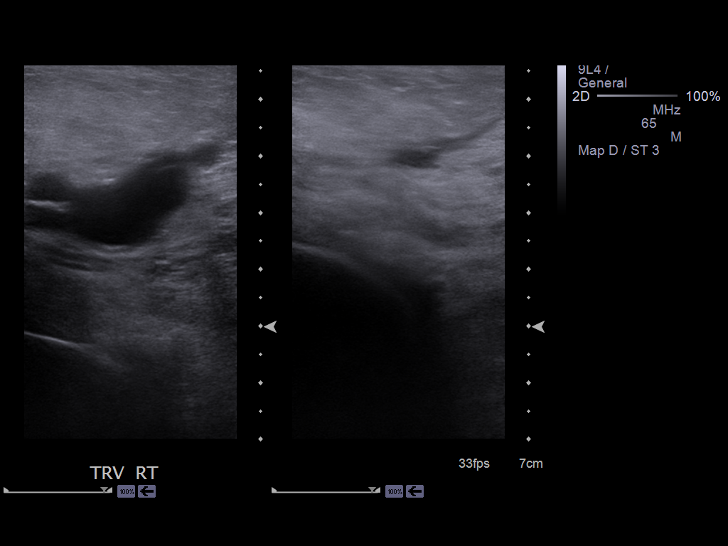
[im 8/85]
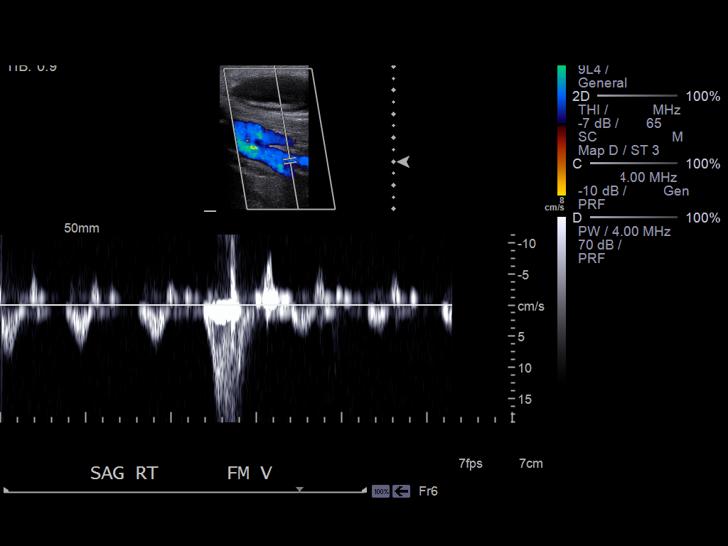
[im 15/85]
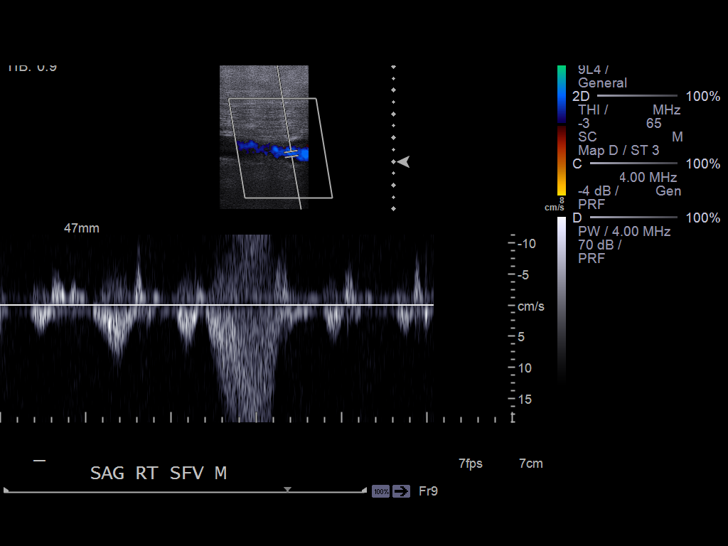
[im 22/85]
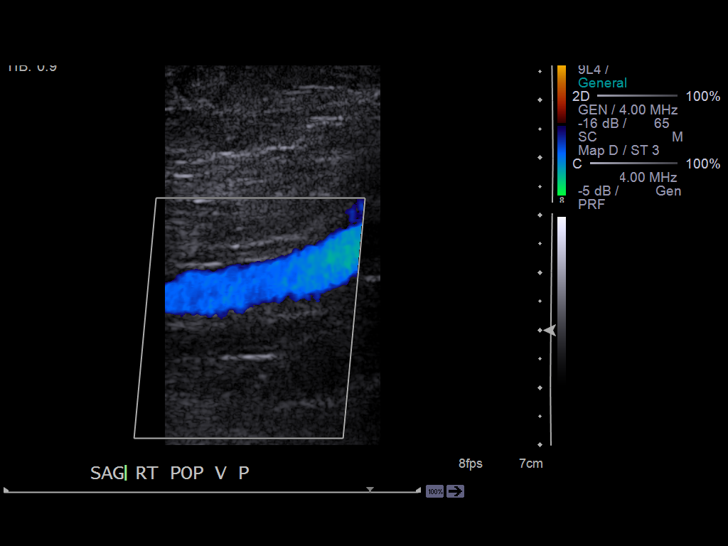
[im 30/85]
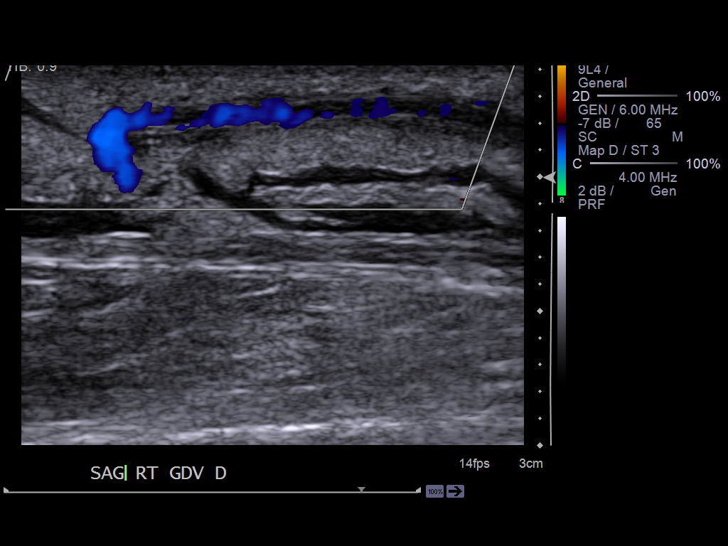
[im 37/85]
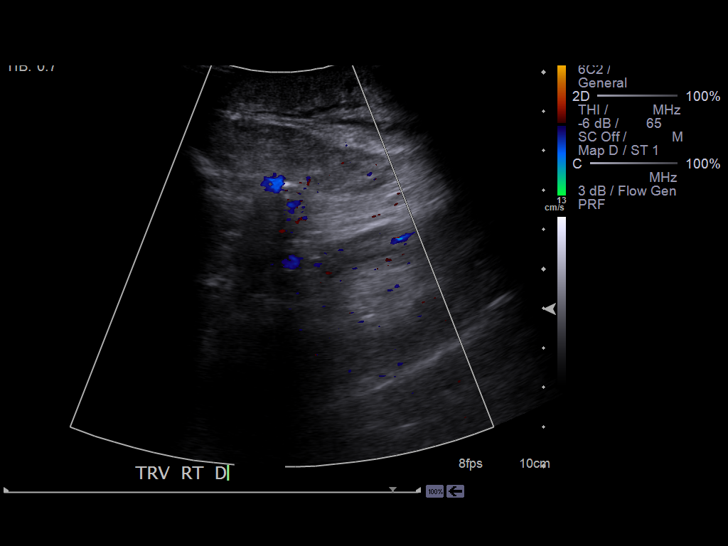
[im 44/85]
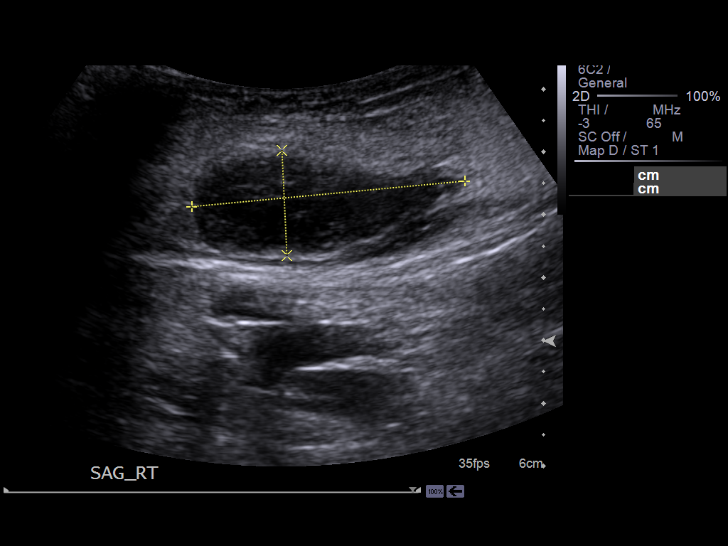
[im 48/85]
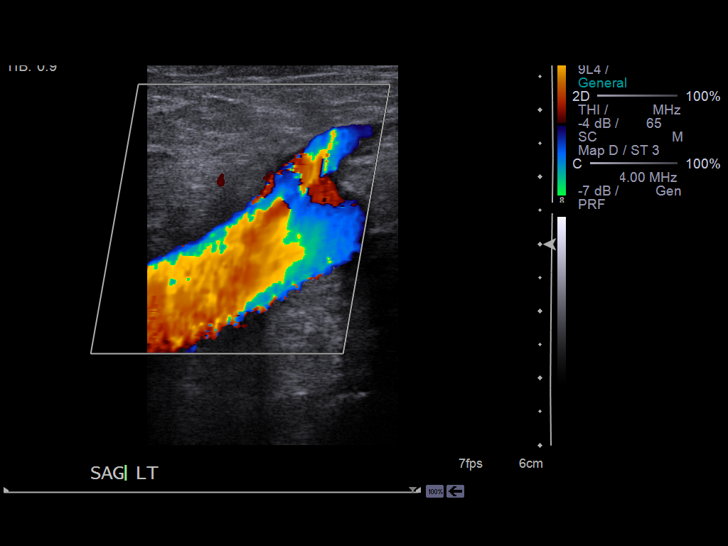
[im 55/85]
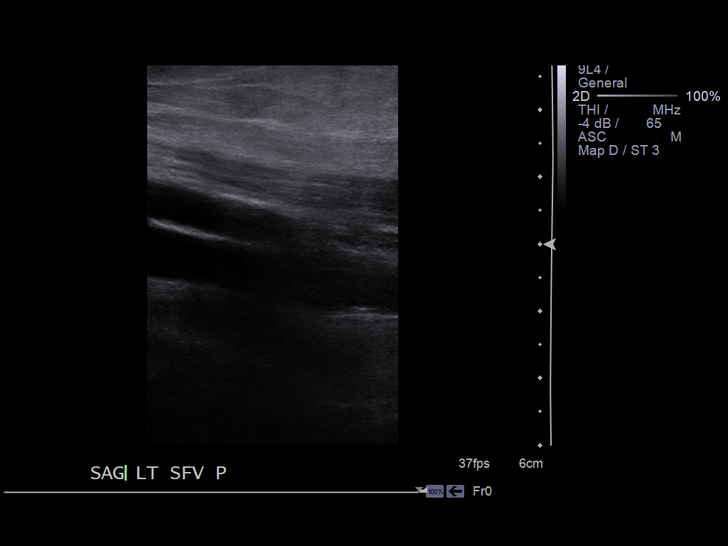
[im 63/85]
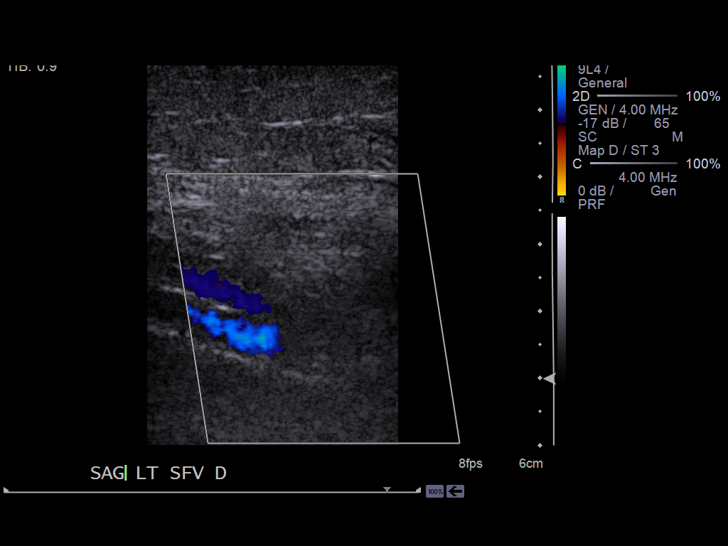
[im 70/85]
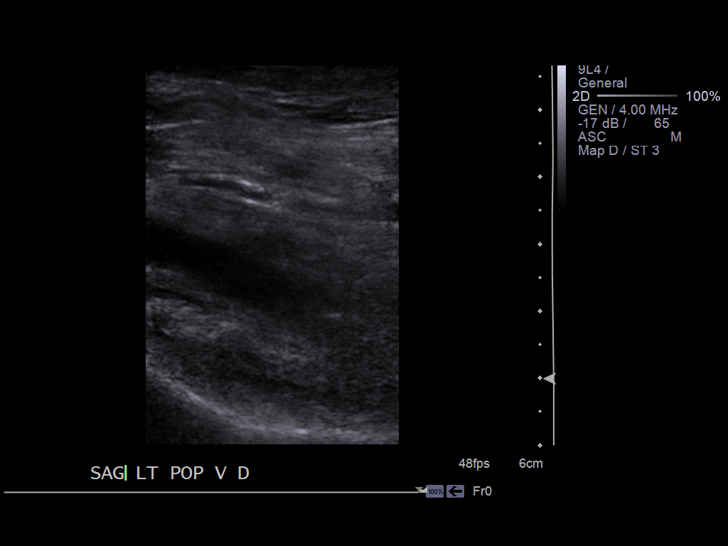
[im 77/85]
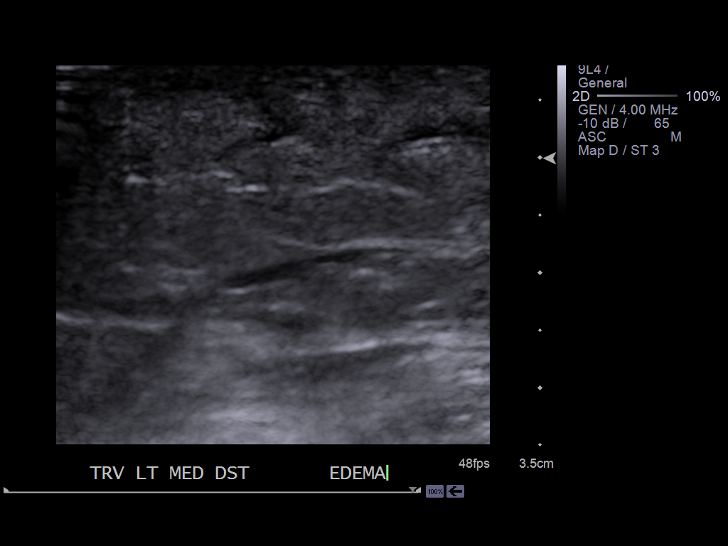
[im 85/85]
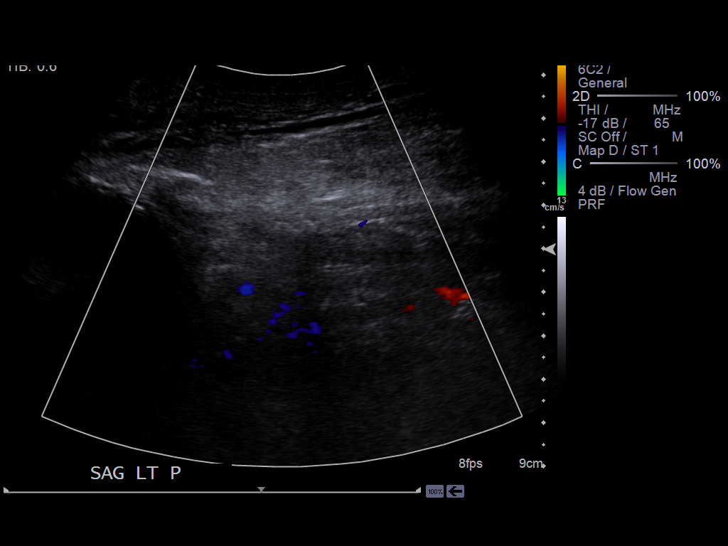

[13 of 24 positions shown; findings below may reference images not displayed]

FINDINGS: All of the visualized deep veins in both lower
extremities demonstrate normal Doppler signal, normal
compressibility, normal phasicity, and normal physiologic response
to augmentation.  No gray scale filling defects were identified.
The visualized greater saphenous veins are similarly patent in both
lower extremities.  Note is made of severe edema involving both
calves.  Note also made of a a complex fluid collection in the
right groin measuring approximately 4.4 x 1.7 x 4.8 cm, consistent
with a resolving hematoma, as it is smaller than on the prior
examination.
IMPRESSION: 1.  No evidence of DVT involving either the right or left lower
extremity.
2.  Resolving hematoma in the right femoral region, smaller than on
a prior right femoral ultrasound 01/15/2011.
3.  Severe edema in the subcutaneous tissues of both calves.

## 2011-11-10 ENCOUNTER — Other Ambulatory Visit: Payer: Self-pay

## 2011-11-10 ENCOUNTER — Emergency Department (HOSPITAL_COMMUNITY)
Admission: EM | Admit: 2011-11-10 | Discharge: 2011-11-10 | Disposition: A | Discharge status: Home / Self Care | Payer: Medicare Other | Attending: Emergency Medicine | Admitting: Emergency Medicine

## 2011-11-10 ENCOUNTER — Encounter (HOSPITAL_COMMUNITY): Payer: Self-pay | Admitting: Emergency Medicine

## 2011-11-10 ENCOUNTER — Emergency Department (HOSPITAL_COMMUNITY): Payer: Medicare Other

## 2011-11-10 DIAGNOSIS — I959 Hypotension, unspecified: Secondary | ICD-10-CM | POA: Insufficient documentation

## 2011-11-10 DIAGNOSIS — I120 Hypertensive chronic kidney disease with stage 5 chronic kidney disease or end stage renal disease: Secondary | ICD-10-CM | POA: Insufficient documentation

## 2011-11-10 DIAGNOSIS — I953 Hypotension of hemodialysis: Secondary | ICD-10-CM | POA: Insufficient documentation

## 2011-11-10 DIAGNOSIS — F17200 Nicotine dependence, unspecified, uncomplicated: Secondary | ICD-10-CM | POA: Insufficient documentation

## 2011-11-10 DIAGNOSIS — Y841 Kidney dialysis as the cause of abnormal reaction of the patient, or of later complication, without mention of misadventure at the time of the procedure: Secondary | ICD-10-CM | POA: Insufficient documentation

## 2011-11-10 DIAGNOSIS — R55 Syncope and collapse: Secondary | ICD-10-CM | POA: Insufficient documentation

## 2011-11-10 DIAGNOSIS — R5381 Other malaise: Secondary | ICD-10-CM | POA: Insufficient documentation

## 2011-11-10 DIAGNOSIS — N186 End stage renal disease: Secondary | ICD-10-CM | POA: Insufficient documentation

## 2011-11-10 DIAGNOSIS — I509 Heart failure, unspecified: Secondary | ICD-10-CM | POA: Insufficient documentation

## 2011-11-10 HISTORY — DX: Dependence on renal dialysis: Z99.2

## 2011-11-10 LAB — CBC
Hemoglobin: 11.4 g/dL — ABNORMAL LOW (ref 13.0–17.0)
MCHC: 33.4 g/dL (ref 30.0–36.0)
RDW: 14.9 % (ref 11.5–15.5)

## 2011-11-10 LAB — DIFFERENTIAL
Basophils Absolute: 0 10*3/uL (ref 0.0–0.1)
Basophils Relative: 0 % (ref 0–1)
Neutro Abs: 3.9 10*3/uL (ref 1.7–7.7)
Neutrophils Relative %: 76 % (ref 43–77)

## 2011-11-10 LAB — POCT I-STAT TROPONIN I: Troponin i, poc: 0.04 ng/mL (ref 0.00–0.08)

## 2011-11-10 LAB — CARDIAC PANEL(CRET KIN+CKTOT+MB+TROPI)
Relative Index: INVALID (ref 0.0–2.5)
Total CK: 37 U/L (ref 7–232)
Troponin I: <0.3 ng/mL (ref <0.30)

## 2011-11-10 LAB — BASIC METABOLIC PANEL
Chloride: 103 mEq/L (ref 96–112)
GFR calc Af Amer: 17 mL/min — ABNORMAL LOW (ref >90)
Potassium: 3.3 mEq/L — ABNORMAL LOW (ref 3.5–5.1)
Sodium: 142 mEq/L (ref 135–145)

## 2011-11-10 MED ORDER — ASPIRIN 81 MG PO CHEW
324.0000 mg | CHEWABLE_TABLET | Freq: Once | ORAL | Status: AC
  Administered 2011-11-10: 324 mg via ORAL
  Filled 2011-11-10: qty 4

## 2011-11-10 NOTE — ED Notes (Signed)
Pt c/o chest pain during dialysis. Pt states his pain was relieved with one nitro tablet. Pt denies any pain at this time.

## 2011-11-10 NOTE — ED Provider Notes (Signed)
History    61 year old male with chest pain. Onset of pain was around 9:30 while at dialysis. Describes his pain as sharp. Located under "this rib" and points to left costal margin. No radiation. Currently no symptoms. Says the pain was relieved when EMS stood him up, shortly after they arrived. No shortness of breath. No diaphoresis.  No palpitations. Says he completed over 2 of his scheduled 4 hours of dialysis. Known coronary artery disease, status post coronary artery bypass grafting. Denies history of blood clots. Reports compliance with his medications. Cardiologist is Dr. Alanda Amass.  CSN: [id number]  Arrival date & time 11/10/11  1011   First MD Initiated Contact with Patient 11/10/11 1025      Chief Complaint  Patient presents with  . Chest Pain    (Consider location/radiation/quality/duration/timing/severity/associated sxs/prior treatment) HPI  Past Medical History  Diagnosis Date  . Hypertension   . Renal insufficiency   . CHF (congestive heart failure)   . Dialysis care     tues, thurs, sat    Past Surgical History  Procedure Date  . Av shunt placement   . Appendectomy   . Cardiac surgery   . Vasculor surgery     History reviewed. No pertinent family history.  History  Substance Use Topics  . Smoking status: Current Everyday Smoker -- 0.5 packs/day    Types: Cigarettes  . Smokeless tobacco: Not on file  . Alcohol Use: No      Review of Systems   Review of symptoms negative unless otherwise noted in HPI.   Allergies  Penicillins  Home Medications   Current Outpatient Rx  Name Route Sig Dispense Refill  . ACETAMINOPHEN 325 MG PO TABS Oral Take 650 mg by mouth every 6 (six) hours as needed. For pain    . ALPRAZOLAM 1 MG PO TABS Oral Take 1 mg by mouth 4 (four) times daily.    . ASPIRIN EC 81 MG PO TBEC Oral Take 81 mg by mouth daily.    Marland Kitchen CINACALCET HCL 30 MG PO TABS Oral Take 30 mg by mouth daily.    Marland Kitchen CLOPIDOGREL BISULFATE 75 MG PO TABS Oral  Take 75 mg by mouth daily.    . IRON 90 (18 FE) MG PO TABS Oral Take 1 tablet by mouth 2 (two) times daily.    Marland Kitchen HYDROCODONE-ACETAMINOPHEN 7.5-750 MG PO TABS Oral Take 1 tablet by mouth every 6 (six) hours as needed. For pain, uses very rarely    . ADULT MULTIVITAMIN W/MINERALS CH Oral Take 1 tablet by mouth daily.    Marland Kitchen NIACIN ER (ANTIHYPERLIPIDEMIC) 1000 MG PO TBCR Oral Take 1,000 mg by mouth at bedtime.    Marland Kitchen NITROGLYCERIN 0.4 MG SL SUBL Sublingual Place 0.4 mg under the tongue every 5 (five) minutes x 3 doses as needed. For chest pain    . PANTOPRAZOLE SODIUM 40 MG PO TBEC Oral Take 40 mg by mouth daily.    Marland Kitchen POTASSIUM CHLORIDE ER 10 MEQ PO TBCR Oral Take 2 tablets (20 mEq total) by mouth 2 (two) times daily. 14 tablet 1  . SIMVASTATIN 40 MG PO TABS Oral Take 40 mg by mouth every morning.    Marland Kitchen VITAMIN B-1 250 MG PO TABS Oral Take 250 mg by mouth 2 (two) times daily.    Marland Kitchen VITAMIN C 500 MG PO TABS Oral Take 500 mg by mouth daily.      BP 198/86  Pulse 74  Temp(Src) 97.8 F (36.6 C) (Oral)  Resp  21  Ht 6' (1.829 m)  Wt 155 lb (70.308 kg)  BMI 21.02 kg/m2  SpO2 100%  Physical Exam  Nursing note and vitals reviewed. Constitutional: He appears well-developed and well-nourished. No distress.       Sitting up in bed. No acute distress. Pleasant.  HENT:  Head: Normocephalic and atraumatic.  Eyes: Conjunctivae are normal. Right eye exhibits no discharge. Left eye exhibits no discharge.  Neck: Neck supple.  Cardiovascular: Normal rate, regular rhythm and normal heart sounds.  Exam reveals no gallop and no friction rub.   No murmur heard.      Dialysis access left upper extremity.  Pulmonary/Chest: Effort normal and breath sounds normal. No respiratory distress.  Abdominal: Soft. He exhibits no distension. There is no tenderness.  Musculoskeletal: He exhibits edema. He exhibits no tenderness.       Mild symmetric lower extremity edema. No calf tenderness. Negative Homans sign.    Neurological: He is alert.  Skin: Skin is warm and dry. He is not diaphoretic.  Psychiatric: He has a normal mood and affect. His behavior is normal. Thought content normal.    ED Course  Procedures (including critical care time)  Labs Reviewed  CBC - Abnormal; Notable for the following:    RBC 3.50 (*)    Hemoglobin 11.4 (*)    HCT 34.1 (*)    Platelets 138 (*)    All other components within normal limits  BASIC METABOLIC PANEL - Abnormal; Notable for the following:    Potassium 3.3 (*)    Creatinine, Ser 4.18 (*)    GFR calc non Af Amer 14 (*)    GFR calc Af Amer 17 (*)    All other components within normal limits  DIFFERENTIAL  CARDIAC PANEL(CRET KIN+CKTOT+MB+TROPI)  POCT I-STAT TROPONIN I  LAB REPORT - SCANNED   No results found.  Dg Chest 2 View  11/10/2011  *RADIOLOGY REPORT*  Clinical Data: This chest pain  CHEST - 2 VIEW  Comparison: Chest radiograph 03/06/2011  Findings: Midline sternotomy wires overlie normal cardiac silhouette.  No effusion, infiltrate, or pneumothorax. No acute osseous abnormality.  IMPRESSION: No acute cardiopulmonary process.  Original Report Authenticated By: Genevive Bi, M.D.   EKG:  Rhythm: Normal sinus Rate:  Axis: Left axis deviation Intervals: First degree AV block. Intra-ventricular conduction delay. ST segments: mild ST depression in leads V5 and V6 and also in the inferior leads. This is not significantly changed from patient's prior EKGs from this January and May of last year.   1. Chest pain       MDM  61 year old male with CP. Workup relatively unremarkable aside from hypokalemia. Renal function as expected. Potassium was not repleted given renal failure and expect this to rise. Consider ACS. Pt does hav.e known CAD. Pt is atypical though. EKG with nonspecific changes and no significant change from priors. Troponin is normal. Chest x-ray with no acute findings. Doubt PE. He has a nonfocal neurological examination and no  confusion per his family at bedside. Since being in the emergency room he states that he feels better when to go home. I think this is reasonable. Strict return precautions discussed. Outpt fu.        Raeford Razor, MD 11/18/11 2230

## 2012-03-31 ENCOUNTER — Telehealth: Payer: Self-pay | Admitting: Gastroenterology

## 2012-03-31 NOTE — Telephone Encounter (Signed)
Please return patient's call at [phone]

## 2012-03-31 NOTE — Telephone Encounter (Signed)
Pt called and said he will be going somewhere else for colonoscopy. I will send letter to PCP.

## 2012-04-01 ENCOUNTER — Encounter (INDEPENDENT_AMBULATORY_CARE_PROVIDER_SITE_OTHER): Payer: Self-pay | Admitting: *Deleted

## 2012-04-09 ENCOUNTER — Other Ambulatory Visit (INDEPENDENT_AMBULATORY_CARE_PROVIDER_SITE_OTHER): Payer: Self-pay | Admitting: *Deleted

## 2012-04-09 ENCOUNTER — Telehealth (INDEPENDENT_AMBULATORY_CARE_PROVIDER_SITE_OTHER): Payer: Self-pay | Admitting: *Deleted

## 2012-04-09 ENCOUNTER — Encounter (INDEPENDENT_AMBULATORY_CARE_PROVIDER_SITE_OTHER): Payer: Self-pay | Admitting: *Deleted

## 2012-04-09 DIAGNOSIS — Z1211 Encounter for screening for malignant neoplasm of colon: Secondary | ICD-10-CM

## 2012-04-09 DIAGNOSIS — Z86010 Personal history of colonic polyps: Secondary | ICD-10-CM

## 2012-04-09 NOTE — Telephone Encounter (Signed)
Patient needs MoviPrep

## 2012-04-10 MED ORDER — PEG-KCL-NACL-NASULF-NA ASC-C 100 G PO SOLR: 1.0000 | Freq: Once | ORAL | Status: DC

## 2012-05-02 ENCOUNTER — Encounter (HOSPITAL_COMMUNITY): Payer: Self-pay | Admitting: Pharmacy Technician

## 2012-05-05 ENCOUNTER — Telehealth (INDEPENDENT_AMBULATORY_CARE_PROVIDER_SITE_OTHER): Payer: Self-pay | Admitting: *Deleted

## 2012-05-05 NOTE — Telephone Encounter (Signed)
PCP/Requesting MD: fusco  Name & DOB: Frederick [name] 02/23/2051     Procedure: tcs  Reason/Indication:  Hx polyps  Has patient had this procedure before?  yes  If so, when, by whom and where?  3 yrs ago  Is there a family history of colon cancer?  no  Who?  What age when diagnosed?    Is patient diabetic?   no      Does patient have prosthetic heart valve?  no  Do you have a pacemaker?  no  Has patient had joint replacement within last 12 months?  no  Is patient on Coumadin, Plavix and/or Aspirin? yes  Medications: asa 81 mg daily, losartan 20 mg daily, carvedilol 25 mg daily, clopidogrel 75 mg daily, trazadone 100 mg prn, simvastatin 20 mg daily  Allergies: pcn  Medication Adjustment: asa 2 days  Procedure date & time: 05/14/12 at 1200

## 2012-05-05 NOTE — Telephone Encounter (Signed)
Agree 

## 2012-05-07 ENCOUNTER — Encounter (INDEPENDENT_AMBULATORY_CARE_PROVIDER_SITE_OTHER): Payer: Self-pay | Admitting: *Deleted

## 2012-05-14 ENCOUNTER — Encounter (HOSPITAL_COMMUNITY): Payer: Self-pay | Admitting: *Deleted

## 2012-05-14 ENCOUNTER — Encounter (HOSPITAL_COMMUNITY)
Admission: RE | Disposition: A | Discharge status: Home Health | Payer: Self-pay | Source: Ambulatory Visit | Attending: Internal Medicine

## 2012-05-14 ENCOUNTER — Ambulatory Visit (HOSPITAL_COMMUNITY)
Admission: RE | Admit: 2012-05-14 | Discharge: 2012-05-14 | Disposition: A | Discharge status: Home Health | Payer: Medicare Other | Source: Ambulatory Visit | Attending: Internal Medicine | Admitting: Internal Medicine

## 2012-05-14 DIAGNOSIS — Z860100 Personal history of colon polyps, unspecified: Secondary | ICD-10-CM | POA: Insufficient documentation

## 2012-05-14 DIAGNOSIS — D126 Benign neoplasm of colon, unspecified: Secondary | ICD-10-CM

## 2012-05-14 DIAGNOSIS — I10 Essential (primary) hypertension: Secondary | ICD-10-CM | POA: Insufficient documentation

## 2012-05-14 DIAGNOSIS — Z09 Encounter for follow-up examination after completed treatment for conditions other than malignant neoplasm: Secondary | ICD-10-CM | POA: Insufficient documentation

## 2012-05-14 DIAGNOSIS — Z79899 Other long term (current) drug therapy: Secondary | ICD-10-CM | POA: Insufficient documentation

## 2012-05-14 HISTORY — DX: Anemia, unspecified: D64.9

## 2012-05-14 HISTORY — DX: Chronic kidney disease, unspecified: N18.9

## 2012-05-14 HISTORY — PX: COLONOSCOPY: SHX5424

## 2012-05-14 SURGERY — COLONOSCOPY
Anesthesia: Moderate Sedation

## 2012-05-14 MED ORDER — MIDAZOLAM HCL 5 MG/5ML IJ SOLN
INTRAMUSCULAR | Status: DC | PRN
  Administered 2012-05-14 (×5): 2 mg via INTRAVENOUS

## 2012-05-14 MED ORDER — MIDAZOLAM HCL 5 MG/5ML IJ SOLN
INTRAMUSCULAR | Status: AC
  Filled 2012-05-14: qty 10

## 2012-05-14 MED ORDER — STERILE WATER FOR IRRIGATION IR SOLN
Status: DC | PRN
  Administered 2012-05-14: 11:00:00

## 2012-05-14 MED ORDER — SODIUM CHLORIDE 0.9 % IV SOLN
INTRAVENOUS | Status: DC
  Administered 2012-05-14: 11:00:00 via INTRAVENOUS

## 2012-05-14 MED ORDER — MEPERIDINE HCL 50 MG/ML IJ SOLN
INTRAMUSCULAR | Status: AC
  Filled 2012-05-14: qty 1

## 2012-05-14 MED ORDER — MEPERIDINE HCL 50 MG/ML IJ SOLN
INTRAMUSCULAR | Status: DC | PRN
  Administered 2012-05-14 (×4): 25 mg via INTRAVENOUS

## 2012-05-14 MED ORDER — SODIUM CHLORIDE 0.45 % IV SOLN: Freq: Once | INTRAVENOUS | Status: DC

## 2012-05-14 NOTE — Op Note (Signed)
COLONOSCOPY PROCEDURE REPORT  PATIENT:  Frederick [name]  MR#:  [id number] Birthdate:  1951-09-13, 61 y.o., male Endoscopist:  Dr. Malissa Hippo, MD Referred By:  Dr. Madelin Rear. Sherwood Gambler, MD Procedure Date: 05/14/2012  Procedure:   Colonoscopy  Indications:  Patient is 61 year old Caucasian male with multiple colonic adenomas removed in August 2010. Last exam was by Dr. Raj Janus. He is here for surveillance colonoscopy.  Informed Consent:  The procedure and risks were reviewed with the patient and informed consent was obtained.  Medications:  Demerol 100 mg IV Versed 10 mg IV  Description of procedure:  After a digital rectal exam was performed, that colonoscope was advanced from the anus through the rectum and colon to the area of the cecum, ileocecal valve and appendiceal orifice. The cecum was deeply intubated. These structures were well-seen and photographed for the record. From the level of the cecum and ileocecal valve, the scope was slowly and cautiously withdrawn. The mucosal surfaces were carefully surveyed utilizing scope tip to flexion to facilitate fold flattening as needed. The scope was pulled down into the rectum where a thorough exam including retroflexion was performed.  Findings:   Prep excellent. Two small polyps ablated via cold biopsy from ascending colon and submitted together. Two small polyps at transverse colon and a background of scar. Both of these polyps were biopsied for histology and residual polyps ablated with argon plasma coagulator. Three small polyps at sigmoid colon were ablated with argon plasma coagulator. Normal rectal mucosa and ano-rectal junction.  Therapeutic/Diagnostic Maneuvers Performed:  See above  Complications:  None  Cecal Withdrawal Time:  18 minutes  Impression:  Examination performed to cecum. Two small polyps ablated via cold biopsy from ascending colon. Two small polyps at transverse colon biopsy for histology and ablated  with APC. Three small polyps at sigmoid colon were ablated using APC.  Recommendations:  Standard instructions given. I will contact patient with biopsy results and further recommendations.  Sheanna Dail U  05/14/2012 12:13 PM  CC: Dr. Cassell Smiles., MD & Dr. Bonnetta Barry ref. provider found

## 2012-05-14 NOTE — H&P (Signed)
Frederick [name] is an 61 y.o. male.   Chief Complaint:  Patient is here for colonoscopy. HPI: Patient is 61 year old Caucasian male with history of colonic adenomas. He is here for surveillance colonoscopy. His last exam was in August 2010 and had multiple adenomas removed. He denies abdominal pain or rectal bleeding. He is intermittent constipation since he's been on dialysis. He takes laxative usually once every 3 weeks. Family history is significant for colonic polyps and his father when he was in the 40s.  Past Medical History  Diagnosis Date  . Hypertension   . CHF (congestive heart failure)   . Dialysis care     tues, thurs, sat  . Renal failure, chronic 2007  . Anemia     Past Surgical History  Procedure Date  . Av shunt placement   . Appendectomy   . Cardiac surgery     Replaced a vein in heart  . Vasculor surgery     Family History  Problem Relation Age of Onset  . Colon cancer Neg Hx    Social History:  reports that he has been smoking Cigarettes.  He has a 2.5 pack-year smoking history. He does not have any smokeless tobacco history on file. He reports that he does not drink alcohol or use illicit drugs.  Allergies:  Allergies  Allergen Reactions  . Penicillins Anaphylaxis    Medications Prior to Admission  Medication Sig Dispense Refill  . ALPRAZolam (XANAX) 1 MG tablet Take 1 mg by mouth daily as needed. ONLY ON DAYS OF DIALYSIS Anxiety/Sleep      . carvedilol (COREG) 25 MG tablet Take 25 mg by mouth 2 (two) times daily with a meal.      . peg 3350 powder (MOVIPREP) 100 G SOLR Take 1 kit (100 g total) by mouth once.  1 kit  0  . aspirin EC 81 MG tablet Take 81 mg by mouth every morning.       . clopidogrel (PLAVIX) 75 MG tablet Take 75 mg by mouth every morning.       . Ferrous Sulfate (IRON) 90 (18 FE) MG TABS Take 1 tablet by mouth every morning.       Marland Kitchen HYDROcodone-acetaminophen (VICODIN ES) 7.5-750 MG per tablet Take 1 tablet by mouth every 6 (six) hours as  needed. For pain, uses very rarely      . losartan (COZAAR) 100 MG tablet Take 100 mg by mouth every morning.      . Multiple Vitamin (MULITIVITAMIN WITH MINERALS) TABS Take 1 tablet by mouth every morning.       . niacin 100 MG tablet Take 100 mg by mouth 4 (four) times a week.      . nitroGLYCERIN (NITROSTAT) 0.4 MG SL tablet Place 0.4 mg under the tongue every 5 (five) minutes x 3 doses as needed. For chest pain      . simvastatin (ZOCOR) 20 MG tablet Take 20 mg by mouth every evening.      . traZODone (DESYREL) 100 MG tablet Take 100 mg by mouth at bedtime as needed. FOR SLEEP      . vitamin C (ASCORBIC ACID) 500 MG tablet Take 500 mg by mouth every morning.         No results found for this or any previous visit (from the past 48 hour(s)). No results found.  ROS  Blood pressure 154/100, pulse 94, temperature 97.7 F (36.5 C), temperature source Oral, resp. rate 14, height 6' (1.829 m), weight  172 lb (78.019 kg), SpO2 100.00%. Physical Exam  Constitutional: He appears well-developed and well-nourished.  HENT:  Mouth/Throat: Oropharynx is clear and moist.  Eyes: Conjunctivae are normal. No scleral icterus.  Neck: No thyromegaly present.  Cardiovascular: Normal rate, regular rhythm and normal heart sounds.   No murmur heard. Respiratory: Effort normal and breath sounds normal.  GI: Soft. He exhibits no distension and no mass. There is no tenderness.  Musculoskeletal: He exhibits no edema.  Lymphadenopathy:    He has no cervical adenopathy.  Neurological: He is alert.  Skin: Skin is warm and dry.     Assessment/Plan History of colonic polyps. Surveillance colonoscopy.  REHMAN,NAJEEB U 05/14/2012, 11:22 AM

## 2012-05-16 ENCOUNTER — Encounter (HOSPITAL_COMMUNITY): Payer: Self-pay | Admitting: Internal Medicine

## 2012-05-21 ENCOUNTER — Encounter (INDEPENDENT_AMBULATORY_CARE_PROVIDER_SITE_OTHER): Payer: Self-pay | Admitting: *Deleted

## 2012-08-18 ENCOUNTER — Encounter: Payer: Self-pay | Admitting: Vascular Surgery

## 2012-09-30 ENCOUNTER — Other Ambulatory Visit (HOSPITAL_COMMUNITY): Payer: Self-pay | Admitting: Cardiovascular Disease

## 2012-09-30 DIAGNOSIS — I6529 Occlusion and stenosis of unspecified carotid artery: Secondary | ICD-10-CM

## 2012-09-30 DIAGNOSIS — I739 Peripheral vascular disease, unspecified: Secondary | ICD-10-CM

## 2012-10-20 ENCOUNTER — Encounter (HOSPITAL_COMMUNITY): Payer: Medicare Other

## 2012-10-20 ENCOUNTER — Other Ambulatory Visit: Payer: Self-pay | Admitting: *Deleted

## 2012-10-20 DIAGNOSIS — I6529 Occlusion and stenosis of unspecified carotid artery: Secondary | ICD-10-CM

## 2012-10-20 DIAGNOSIS — Z48812 Encounter for surgical aftercare following surgery on the circulatory system: Secondary | ICD-10-CM

## 2012-10-24 ENCOUNTER — Ambulatory Visit: Payer: Medicare Other | Admitting: Neurosurgery

## 2012-10-24 ENCOUNTER — Other Ambulatory Visit: Payer: Medicare Other

## 2012-11-10 ENCOUNTER — Encounter (HOSPITAL_COMMUNITY): Payer: Medicare Other

## 2012-11-21 ENCOUNTER — Encounter (HOSPITAL_COMMUNITY): Payer: Medicare Other

## 2012-12-01 ENCOUNTER — Other Ambulatory Visit (HOSPITAL_COMMUNITY): Payer: Self-pay | Admitting: Internal Medicine

## 2012-12-01 DIAGNOSIS — N63 Unspecified lump in unspecified breast: Secondary | ICD-10-CM

## 2012-12-01 DIAGNOSIS — N6320 Unspecified lump in the left breast, unspecified quadrant: Secondary | ICD-10-CM

## 2012-12-17 ENCOUNTER — Ambulatory Visit (HOSPITAL_COMMUNITY)
Admission: RE | Admit: 2012-12-17 | Discharge: 2012-12-17 | Disposition: A | Discharge status: Home / Self Care | Payer: Medicare Other | Source: Ambulatory Visit | Attending: Internal Medicine | Admitting: Internal Medicine

## 2012-12-17 ENCOUNTER — Encounter (HOSPITAL_COMMUNITY): Payer: Medicare Other

## 2012-12-17 DIAGNOSIS — N6320 Unspecified lump in the left breast, unspecified quadrant: Secondary | ICD-10-CM

## 2012-12-17 DIAGNOSIS — N62 Hypertrophy of breast: Secondary | ICD-10-CM | POA: Insufficient documentation

## 2012-12-17 DIAGNOSIS — N63 Unspecified lump in unspecified breast: Secondary | ICD-10-CM | POA: Insufficient documentation

## 2012-12-18 ENCOUNTER — Encounter: Payer: Self-pay | Admitting: Neurosurgery

## 2012-12-19 ENCOUNTER — Other Ambulatory Visit: Payer: Medicare Other

## 2012-12-19 ENCOUNTER — Ambulatory Visit: Payer: Medicare Other | Admitting: Neurosurgery

## 2012-12-22 ENCOUNTER — Ambulatory Visit (HOSPITAL_COMMUNITY)
Admission: RE | Admit: 2012-12-22 | Discharge: 2012-12-22 | Disposition: A | Discharge status: Home / Self Care | Payer: Medicare Other | Source: Ambulatory Visit | Attending: Cardiovascular Disease | Admitting: Cardiovascular Disease

## 2012-12-22 DIAGNOSIS — I509 Heart failure, unspecified: Secondary | ICD-10-CM | POA: Insufficient documentation

## 2012-12-22 DIAGNOSIS — I10 Essential (primary) hypertension: Secondary | ICD-10-CM | POA: Insufficient documentation

## 2012-12-22 HISTORY — DX: Essential (primary) hypertension: I10

## 2012-12-22 NOTE — Progress Notes (Signed)
*  PRELIMINARY RESULTS* Echocardiogram 2D Echocardiogram has been performed.  Conrad Culbertson 12/22/2012, 2:15 PM

## 2012-12-25 ENCOUNTER — Ambulatory Visit: Payer: Medicare Other | Admitting: Neurosurgery

## 2012-12-25 ENCOUNTER — Other Ambulatory Visit: Payer: Medicare Other

## 2012-12-31 ENCOUNTER — Encounter: Payer: Self-pay | Admitting: Vascular Surgery

## 2013-01-01 ENCOUNTER — Ambulatory Visit: Payer: Medicare Other | Admitting: Vascular Surgery

## 2013-01-01 ENCOUNTER — Other Ambulatory Visit: Payer: Medicare Other

## 2013-01-02 ENCOUNTER — Other Ambulatory Visit: Payer: Medicare Other

## 2013-01-02 ENCOUNTER — Ambulatory Visit: Payer: Medicare Other | Admitting: Neurosurgery

## 2013-01-13 ENCOUNTER — Encounter (HOSPITAL_COMMUNITY): Admission: EM | Disposition: A | Discharge status: Home / Self Care | Payer: Self-pay | Source: Home / Self Care

## 2013-01-13 ENCOUNTER — Ambulatory Visit (HOSPITAL_COMMUNITY)
Admission: EM | Admit: 2013-01-13 | Discharge: 2013-01-13 | Disposition: A | Discharge status: Home / Self Care | Payer: Medicare Other | Attending: Surgery | Admitting: Surgery

## 2013-01-13 ENCOUNTER — Encounter (HOSPITAL_COMMUNITY): Payer: Self-pay | Admitting: Emergency Medicine

## 2013-01-13 DIAGNOSIS — I871 Compression of vein: Secondary | ICD-10-CM | POA: Insufficient documentation

## 2013-01-13 DIAGNOSIS — Z7902 Long term (current) use of antithrombotics/antiplatelets: Secondary | ICD-10-CM | POA: Insufficient documentation

## 2013-01-13 DIAGNOSIS — I509 Heart failure, unspecified: Secondary | ICD-10-CM | POA: Insufficient documentation

## 2013-01-13 DIAGNOSIS — E7800 Pure hypercholesterolemia, unspecified: Secondary | ICD-10-CM | POA: Insufficient documentation

## 2013-01-13 DIAGNOSIS — Z992 Dependence on renal dialysis: Secondary | ICD-10-CM | POA: Insufficient documentation

## 2013-01-13 DIAGNOSIS — N186 End stage renal disease: Secondary | ICD-10-CM

## 2013-01-13 DIAGNOSIS — Y832 Surgical operation with anastomosis, bypass or graft as the cause of abnormal reaction of the patient, or of later complication, without mention of misadventure at the time of the procedure: Secondary | ICD-10-CM | POA: Insufficient documentation

## 2013-01-13 DIAGNOSIS — T82898A Other specified complication of vascular prosthetic devices, implants and grafts, initial encounter: Secondary | ICD-10-CM

## 2013-01-13 DIAGNOSIS — I120 Hypertensive chronic kidney disease with stage 5 chronic kidney disease or end stage renal disease: Secondary | ICD-10-CM | POA: Insufficient documentation

## 2013-01-13 HISTORY — PX: SHUNTOGRAM: SHX5491

## 2013-01-13 LAB — POCT I-STAT, CHEM 8
Calcium, Ion: 1.33 mmol/L — ABNORMAL HIGH (ref 1.13–1.30)
Chloride: 97 mEq/L (ref 96–112)
Creatinine, Ser: 9.1 mg/dL — ABNORMAL HIGH (ref 0.50–1.35)
Glucose, Bld: 123 mg/dL — ABNORMAL HIGH (ref 70–99)
Hemoglobin: 9.5 g/dL — ABNORMAL LOW (ref 13.0–17.0)
Potassium: 4 mEq/L (ref 3.5–5.1)

## 2013-01-13 SURGERY — ASSESSMENT, SHUNT FUNCTION, WITH CONTRAST RADIOGRAPHIC STUDY
Anesthesia: LOCAL | Laterality: Left

## 2013-01-13 MED ORDER — LABETALOL HCL 5 MG/ML IV SOLN: 10.0000 mg | INTRAVENOUS | Status: DC | PRN

## 2013-01-13 MED ORDER — HYDRALAZINE HCL 20 MG/ML IJ SOLN
10.0000 mg | INTRAMUSCULAR | Status: DC | PRN
  Administered 2013-01-13: 10 mg via INTRAVENOUS

## 2013-01-13 MED ORDER — HEPARIN SODIUM (PORCINE) 1000 UNIT/ML IJ SOLN
INTRAMUSCULAR | Status: AC
  Filled 2013-01-13: qty 1

## 2013-01-13 MED ORDER — HEPARIN (PORCINE) IN NACL 2-0.9 UNIT/ML-% IJ SOLN
INTRAMUSCULAR | Status: AC
  Filled 2013-01-13: qty 500

## 2013-01-13 MED ORDER — HYDRALAZINE HCL 20 MG/ML IJ SOLN
INTRAMUSCULAR | Status: AC
  Filled 2013-01-13: qty 1

## 2013-01-13 MED ORDER — LIDOCAINE HCL (PF) 1 % IJ SOLN
INTRAMUSCULAR | Status: AC
  Filled 2013-01-13: qty 30

## 2013-01-13 NOTE — ED Notes (Signed)
Pt here after going to dialysis and having bleeding around access site the center got the bleeding controlled and sent him home; Dr Myra Gianotti told pt to come to ED for eval; pt sts on plavix

## 2013-01-13 NOTE — Op Note (Signed)
[facility]  Patient name: GOLDMAN BIRCHALL MRN: [id number] DOB: 09-10-1951 Sex: male  01/13/2013 Pre-operative Diagnosis: Poorly function left upper arm graft Post-operative diagnosis:  Same Surgeon:  Jorge Ny Procedure Performed:  1.  ultrasound access left upper arm dialysis graft  2.  shuntogram  3.  angioplasty left cephalic vein x2  4.  followup x1     Indications:  The patient's dialysis Center called this morning with profuse bleeding from his attempted access of his graft. He did not receive dialysis but was sent home when he contacted our office we told him to come to the emergency department. There was no bleeding from the graft when I saw him I suspected a central stenosis and therefore recommend fistulogram he comes in to have this performed.  Procedure:  The patient was identified in the holding area and taken to room 8.  The patient was then placed supine on the table and prepped and draped in the usual sterile fashion.  A time out was called.  Ultrasound was used to evaluate the fistula.  The vein was patent and compressible.  A digital ultrasound image was acquired.  The fistula was then accessed under ultrasound guidance using a micropuncture needle.  An 018 wire was then asvanced without resistance and a micropuncture sheath was placed.  Contrast injections were then performed through the sheath.  Findings:  The left upper arm graft is patent throughout the upper extremity. There is no evidence of stenosis at the arterial venous anastomosis. The venous anastomosis appears to be at 2 branches. There is moderate stenosis within each branch. There is no evidence of central venous stenosis.   Intervention:  After the above images were acquired, the decision was made to proceed with intervention on both of the draining branches. A 6 French sheath was placed in 3000 units of heparin were administered. Using a Kumpe catheter and a Glidewire I  selected both the anterior and posterior draining branches. Using a 7 x 40 balloon, balloon and plasty was performed across each of these branches. The more superior branch I was able to take the balloon to grade pressure. On the inferior branch, there was a high-grade stenosis that I was only comfortable taking the balloon to 10 atmospheres which correlated with a diameter of 7.4 mm. Completion studies required additional balloon and plasty of the inferior segment. I felt on the completion images that there was significantly improved blood flow through the draining veins, however there does remained a persistent stenosis which I was not comfortable taking the balloon higher. After I was satisfied the balloon and wires were removed. Pursestring suture was used to close the access site. Endoscopic  Impression:  #1  no evidence of stenosis at the arterial venous anastomosis.  #2  no evidence of central venous stenosis  #3  2 large outflow veins appear to have stenosis. Each vein was treated using a 7 mm balloon. The inferior branch has approximately a 20% residual stenosis that I was uncomfortable dilating any further for fear of rupture.   Juleen China, M.D. [facility] Office: [phone] Pager:  [phone]

## 2013-01-13 NOTE — ED Notes (Signed)
Dr Brabham at bedside. 

## 2013-01-13 NOTE — H&P (Signed)
[facility]      Consult Note  Patient name: Frederick [name] MRN: [id number] DOB: [birth year] Sex: male  Consulting Physician:  ER  Reason for Consult:  Chief Complaint  Patient presents with  . Vascular Access Problem    HISTORY OF PRESENT ILLNESS: The patient was sent to the emergency department by the dialysis center that this morning. Apparently they tried to access his left upper arm graft and found profuse bleeding. He did not receive dialysis. Manual pressure was held and the patient was sent home with instructions to return to the emergency department. The patient has not had a bleeding since he was at home. He is managed for his hypercholesterolemia with a statin. He is on full H. antiplatelet therapy for his coronary artery disease. He is medically managed for his hypertension. He has chronic renal failure. He has dialysis on Tuesday Thursday Saturday. 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 Darrick Penna in February of 2012.  Past Medical History  Diagnosis Date  . Hypertension   . CHF (congestive heart failure)   . Dialysis care     tues, thurs, sat  . Renal failure, chronic 2007  . Anemia     Past Surgical History  Procedure Laterality Date  . Av shunt placement    . Appendectomy    . Cardiac surgery      Replaced a vein in heart  . Vasculor surgery    . Colonoscopy  05/14/2012    Procedure: COLONOSCOPY;  Surgeon: Malissa Hippo, MD;  Location: [facility];  Service: Endoscopy;  Laterality: N/A;  12:00 noon    History   Social History  . Marital Status: Divorced    Spouse Name: N/A    Number of Children: N/A  . Years of Education: N/A   Occupational History  . Not on file.   Social History Main Topics  . Smoking status: Current Every Day Smoker -- 0.50 packs/day for 5 years    Types: Cigarettes  . Smokeless tobacco: Not on file  . Alcohol Use: No  . 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 Sexually Active: Not Currently   Other Topics Concern  . Not on file   Social History Narrative  . No narrative on file    Family History  Problem Relation Age of Onset  . Colon cancer Neg Hx     Allergies as of 01/13/2013 - Review Complete 01/13/2013  Allergen Reaction Noted  . Penicillins Anaphylaxis     No current facility-administered medications on file prior to encounter.   Current Outpatient Prescriptions on File Prior to Encounter  Medication Sig Dispense Refill  . ALPRAZolam (XANAX) 1 MG tablet Take 1 mg by mouth daily as needed. ONLY ON DAYS OF DIALYSIS Anxiety/Sleep      . aspirin EC 81 MG tablet Take 81 mg by mouth every morning.       . carvedilol (COREG) 25 MG tablet Take 25 mg by mouth 2 (two) times daily with a meal.      . clopidogrel (PLAVIX) 75 MG tablet Take 75 mg by mouth every morning.       Marland Kitchen losartan (COZAAR) 100 MG tablet Take 100 mg by mouth every morning.      . nitroGLYCERIN (NITROSTAT) 0.4 MG SL tablet Place 0.4 mg under the tongue every 5 (five) minutes  x 3 doses as needed. For chest pain      . simvastatin (ZOCOR) 20 MG tablet Take 20 mg by mouth every evening.      . traZODone (DESYREL) 100 MG tablet Take 100 mg by mouth at bedtime as needed. FOR SLEEP         REVIEW OF SYSTEMS: Please see history of present illness, otherwise all systems make  PHYSICAL EXAMINATION: General: The patient appears their stated age.  Vital signs are BP 162/83  Pulse 66  Temp(Src) 97.9 F (36.6 C) (Oral)  Resp 19  SpO2 100% Pulmonary: Respirations are non-labored HEENT:  No gross abnormalities Musculoskeletal: There are no major deformities.   Neurologic: No focal weakness or paresthesias are detected, Skin: There are no ulcer or rashes noted. Psychiatric: The patient has normal affect. Cardiovascular: There is a regular rate and rhythm without significant murmur appreciated. Palpable thrill within left  upper arm graft without skin degeneration.    Assessment:  End-stage renal disease with bleeding from dialysis graft Plan: The patient needs a fistulogram to evaluate for stenosis which is causing elevated pressures within his graft. This is been discussed with the patient and his wife. We'll proceed today     V. Charlena Cross, M.D. [facility] Office: [phone] Pager:  [phone]

## 2013-01-28 ENCOUNTER — Encounter: Payer: Self-pay | Admitting: Vascular Surgery

## 2013-01-29 ENCOUNTER — Ambulatory Visit (INDEPENDENT_AMBULATORY_CARE_PROVIDER_SITE_OTHER): Payer: Medicare Other | Admitting: Vascular Surgery

## 2013-01-29 ENCOUNTER — Encounter: Payer: Self-pay | Admitting: Vascular Surgery

## 2013-01-29 ENCOUNTER — Other Ambulatory Visit: Payer: Self-pay | Admitting: *Deleted

## 2013-01-29 ENCOUNTER — Other Ambulatory Visit (INDEPENDENT_AMBULATORY_CARE_PROVIDER_SITE_OTHER): Payer: Medicare Other | Admitting: *Deleted

## 2013-01-29 VITALS — BP 130/76 | HR 57 | Resp 14

## 2013-01-29 DIAGNOSIS — Z48812 Encounter for surgical aftercare following surgery on the circulatory system: Secondary | ICD-10-CM

## 2013-01-29 DIAGNOSIS — I6529 Occlusion and stenosis of unspecified carotid artery: Secondary | ICD-10-CM

## 2013-01-29 DIAGNOSIS — N186 End stage renal disease: Secondary | ICD-10-CM

## 2013-01-29 NOTE — Progress Notes (Signed)
Established Carotid Patient   Date of Surgery: Left CEA 05/17/2008 Surgeon: Frederick Sax, MD ESRD on HD T,TH,SAT - [place] History of Present Illness  Frederick [name] is a 62 y.o. male who has known carotid stenosis.  Patient has had previous Left CEA.  Patient has Negative history of TIA or stroke symptom.  The patient denies amaurosis fugax or monocular blindness.  The patient  denies facial drooping.  Pt. denies hemiplegia.  The patient denies receptive or expressive aphasia.  Pt. denies weakness; upper extremities and lower extremity;  Non-Invasive Vascular Imaging CAROTID DUPLEX 01/29/2013   Right ICA 40 - 59 % stenosis Left ICA 20 - 39 % stenosis  These findings are slightly  Worse on right from previous exam - EDV 50 (38  On 11/17/2008)   Past Medical History  Diagnosis Date  . Hypertension   . CHF (congestive heart failure)   . Dialysis care     tues, thurs, sat  . Renal failure, chronic 2007  . Anemia     Social History History  Substance Use Topics  . Smoking status: Current Every Day Smoker -- 0.50 packs/day for 5 years    Types: Cigarettes  . Smokeless tobacco: Not on file  . Alcohol Use: No    Family History Family History  Problem Relation Age of Onset  . Colon cancer Neg Hx     Review of Systems : [x]  Positive   [ ]  Denies  General:[ ]  Weight loss,  [ ]  Weight gain, [ ]  Loss of appetite, [ ]  Fever, [ ]  chills  Neurologic: [ ]  Dizziness, [ ]  Blackouts, [ ]  Headaches, [ ]  Seizure [ ]  Stroke, [ ]  "Mini stroke", [ ]  Slurred speech, [ ]  Temporary blindness;  [ ] weakness,  Ear/Nose/Throat: [ ]  Change in hearing, [ ]  Nose bleeds, [ ]  Hoarseness  Vascular:[ ]  Pain in legs with walking, [ ]  Pain in feet while lying flat , [ ]   Non-healing ulcer, [ ]  Blood clot in vein,    Pulmonary: [ ]  Home oxygen, [ ]   Productive cough, [ ]  Bronchitis, [ ]  Coughing up blood,  [ ]  Asthma, [ ]  Wheezing  Musculoskeletal:  [ ]  Arthritis, [ ]  Joint pain, [ ]  low back  pain  Cardiac: [ ]  Chest pain, [ ]  Shortness of breath when lying flat, [ ]  Shortness of breath with exertion, [ ]  Palpitations, [ ]  Heart murmur, [ ]   Atrial fibrillation  Hematologic:[ ]  Easy Bruising, [ ]  Anemia; [ ]  Hepatitis  Psychiatric: [ ]   Depression, [ ]  Anxiety   Gastrointestinal: [ ]  Black stool, [ ]  Blood in stool, [ ]  Peptic ulcer disease,  [ ]  Gastroesophageal Reflux, [ ]  Trouble swallowing, [ ]  Diarrhea, [ ]  Constipation  Urinary: [x ] chronic Kidney disease, [x ] on HD, [ ]  Burning with urination, [ ]  Frequent urination, [ ]  Difficulty urinating;   Skin: [ ]  Rashes, [ ]  Wounds   Allergies  Allergen Reactions  . Penicillins Anaphylaxis    Current Outpatient Prescriptions  Medication Sig Dispense Refill  . ALPRAZolam (XANAX) 1 MG tablet Take 1 mg by mouth daily as needed. ONLY ON DAYS OF DIALYSIS Anxiety/Sleep      . aspirin EC 81 MG tablet Take 81 mg by mouth every morning.       . carvedilol (COREG) 25 MG tablet Take 25 mg by mouth daily.       . clopidogrel (PLAVIX) 75 MG tablet Take 75 mg  by mouth every morning.       Marland Kitchen losartan (COZAAR) 100 MG tablet Take 100 mg by mouth every morning.      . simvastatin (ZOCOR) 20 MG tablet Take 20 mg by mouth every evening.      . traZODone (DESYREL) 100 MG tablet Take 100 mg by mouth at bedtime as needed. FOR SLEEP      . HYDROcodone-acetaminophen (NORCO/VICODIN) 5-325 MG per tablet Take 1 tablet by mouth every 6 (six) hours as needed for pain.      . nitroGLYCERIN (NITROSTAT) 0.4 MG SL tablet Place 0.4 mg under the tongue every 5 (five) minutes x 3 doses as needed. For chest pain       No current facility-administered medications for this visit.    Physical Examination  Filed Vitals:   01/29/13 1423  BP: 130/76  Pulse: 57  Resp: 14    General: WDWN male in NAD GAIT: normal Eyes: PERRLA Pulmonary:  CTAB, Negative  Rales, Negative rhonchi, & Negative wheezing,  Cardiac: regular Rhythm Left neck incision well  healed, no carotid bruit Left arm AVF with thrill  Vascular:                   RIGHT        LEFT CAROTID BRUITS Negative Negative  RADIAL present present   Musculoskeletal: good and equal strength in BUE/BLE  Neurologic: A&O X 3; Appropriate Affect ; SENSATION ;normal; MOTOR FUNCTION: normal 5/5 strength in all tested muscle groups Speech is normal  Assessment: Frederick [name] is a 63 y.o. male who presents with:Asymptomatic40 - 59 % Right ICA  stenosis The  ICA stenosis is  Worse from previous exam. Pt. is not a candidate for CEA at this time Pt s/p left ICA 2009 - doing well  Plan: Follow-up in 1 years with Carotid Duplex scan  Pt was given information regarding stroke symptoms and prevention  History and exam details as above. Patient has a moderate right carotid stenosis. He has no recurrent left internal carotid artery stenosis. His left upper arm access is functioning well. He is currently on transplant list to wait for sinus try to get on several other transplant list. He will followup in one year for repeat carotid duplex scan.  Fabienne Bruns, MD [facility] Office: [phone] Pager: [phone]

## 2013-03-27 ENCOUNTER — Telehealth: Payer: Self-pay | Admitting: Cardiovascular Disease

## 2013-03-27 NOTE — Telephone Encounter (Signed)
He need a refill on his Plavix 75mg  #30!f

## 2013-03-31 NOTE — Telephone Encounter (Signed)
[facility] calling to check on his Generic Plavix-still waiting to hear from [place]!

## 2013-03-31 NOTE — Telephone Encounter (Signed)
[place]

## 2013-03-31 NOTE — Telephone Encounter (Signed)
Plavis not on last OV note.  Will have to get records from [place] office.

## 2013-03-31 NOTE — Telephone Encounter (Signed)
Message forwarded to [facility]. Berlinda Last, LPN to discuss w/ Dr. Alanda Amass.  This note and paper chart given to [facility].

## 2013-04-01 NOTE — Telephone Encounter (Signed)
plavix called in  to [facility] pharmacy.

## 2013-04-28 ENCOUNTER — Encounter: Payer: Self-pay | Admitting: Cardiovascular Disease

## 2013-05-21 ENCOUNTER — Encounter (INDEPENDENT_AMBULATORY_CARE_PROVIDER_SITE_OTHER): Payer: Self-pay | Admitting: *Deleted

## 2013-06-22 ENCOUNTER — Ambulatory Visit: Payer: Self-pay | Admitting: Vascular Surgery

## 2013-06-22 LAB — BASIC METABOLIC PANEL
Anion Gap: 11 (ref 7–16)
BUN: 50 mg/dL — ABNORMAL HIGH (ref 7–18)
Calcium, Total: 7.8 mg/dL — ABNORMAL LOW (ref 8.5–10.1)
Co2: 24 mmol/L (ref 21–32)
EGFR (African American): 7 — ABNORMAL LOW
EGFR (Non-African Amer.): 6 — ABNORMAL LOW
Glucose: 93 mg/dL (ref 65–99)
Osmolality: 279 (ref 275–301)
Potassium: 5 mmol/L (ref 3.5–5.1)

## 2013-06-22 LAB — CBC
HCT: 30 % — ABNORMAL LOW (ref 40.0–52.0)
MCHC: 33.9 g/dL (ref 32.0–36.0)
MCV: 98 fL (ref 80–100)
Platelet: 130 10*3/uL — ABNORMAL LOW (ref 150–440)
RBC: 3.08 10*6/uL — ABNORMAL LOW (ref 4.40–5.90)
WBC: 7.6 10*3/uL (ref 3.8–10.6)

## 2013-06-24 ENCOUNTER — Ambulatory Visit (INDEPENDENT_AMBULATORY_CARE_PROVIDER_SITE_OTHER): Payer: Medicare Other | Admitting: Internal Medicine

## 2013-06-26 ENCOUNTER — Ambulatory Visit: Payer: Self-pay | Admitting: Vascular Surgery

## 2013-09-17 ENCOUNTER — Encounter: Payer: Self-pay | Admitting: *Deleted

## 2013-09-18 ENCOUNTER — Ambulatory Visit: Payer: Medicare Other | Admitting: Cardiovascular Disease

## 2013-10-15 DEATH — deceased

## 2014-01-28 ENCOUNTER — Ambulatory Visit: Payer: Medicare Other | Admitting: Vascular Surgery

## 2014-01-28 ENCOUNTER — Other Ambulatory Visit (HOSPITAL_COMMUNITY): Payer: Medicare Other

## 2014-09-23 ENCOUNTER — Encounter (HOSPITAL_COMMUNITY): Payer: Self-pay | Admitting: Surgery

## 2015-02-04 NOTE — Op Note (Signed)
PATIENT NAME:  Frederick [name], SAUSEDO MR#:  [id number] DATE OF BIRTH:  [birth year]  DATE OF PROCEDURE:  06/26/2013  PREOPERATIVE DIAGNOSIS:  End-stage renal disease.   POSTOPERATIVE DIAGNOSES:  End-stage renal disease.   PROCEDURE PERFORMED:  Laparoscopic-assisted insertion of peritoneal dialysis catheter.   SURGEON:  Katha Cabal, M.D.   FIRST ASSISTANT:  Ms. Gillie Manners.   ANESTHESIA:  General by endotracheal intubation.   FLUIDS:  Per anesthesia record.   ESTIMATED BLOOD LOSS:  Minimal.   SPECIMEN:  None.   INDICATIONS:  Mr. Studnicka is a 64 year old gentleman with multiple medical problems who will be initiated on peritoneal dialysis and therefore requires insertion of a catheter.  Risks and benefits were reviewed.  All questions answered.  The patient agrees to proceed.   DESCRIPTION OF PROCEDURE:  The patient is taken to the Operating Room and placed in the supine position.  After adequate general anesthesia is induced, appropriate invasive monitors are placed, he is prepped from the nipple line to the groins.  Timeout is called.   A small incision is created in the infraumbilical area and the fascia is exposed.  It is hooked with a bone hook and a Veress needle is introduced.  The saline test is positive and low flow CO2 insufflation is performed.  Subsequently high flow is initiated and the peritoneal space is pressurized to 15 mmHg.  A 5 mm trocar is inserted.  The camera is then introduced.  Visual inspection of the anatomy demonstrates normal-appearing viscera.  No evidence of significant scars or adhesions.  Therefore, in the left lower quadrant location is selected.  A small oblique incision is made and the fascia is exposed.  Under direct laparoscopic visualization, a needle is introduced into the peritoneal space.  Wire is advanced, followed by the dilator peel-away sheath.  Catheter is advanced through the peel-away sheath over a mandrel and then positioned in the left lower  quadrant.  The cuff is then secured to the fascia with 0 Vicryl suture.  This is performed in a pursestring fashion.  The catheter is then tunneled subcutaneously and is delivered out through a small incision in the left upper quadrant midway between the costal margin and the umbilicus.  A titanium connector is then placed and the hub is connected.  Approximately 200 mL of saline is instilled, followed by retrieval of approximately 200 mL.  Therefore, the two incisions, infraumbilical and left lower quadrant, are closed with interrupted Vicryl, followed by 4-0 Monocryl subcuticular and Dermabond.  The patient tolerated the procedure well and there were no immediate complications.  Sponge and needle counts were correct and he was taken to the recovery area in excellent condition.     ____________________________ Katha Cabal, MD ggs:ea D: 06/26/2013 10:33:00 ET T: 06/26/2013 20:41:34 ET JOB#: [id number]  cc: Katha Cabal, MD, <Dictator> Katha Cabal, MD Katha Cabal MD ELECTRONICALLY SIGNED 07/16/2013 8:38
